# Patient Record
Sex: Female | Born: 1955 | Race: White | Hispanic: No | Smoking: Former smoker
Health system: Southern US, Community
[De-identification: ages and names within clinical notes are randomized; demographics above are authoritative.]

## PROBLEM LIST (undated history)

## (undated) DIAGNOSIS — J449 Chronic obstructive pulmonary disease, unspecified: Secondary | ICD-10-CM

## (undated) DIAGNOSIS — I10 Essential (primary) hypertension: Secondary | ICD-10-CM

---

## 2007-06-12 ENCOUNTER — Emergency Department (HOSPITAL_COMMUNITY): Admission: EM | Admit: 2007-06-12 | Discharge: 2007-06-12 | Payer: Self-pay | Admitting: Emergency Medicine

## 2011-10-18 LAB — CBC
Hemoglobin: 14.7
MCHC: 33.4
Platelets: 225
RDW: 13.6

## 2011-10-18 LAB — DIFFERENTIAL
Basophils Absolute: 0
Basophils Relative: 1
Monocytes Absolute: 0.5
Neutro Abs: 5
Neutrophils Relative %: 62

## 2011-10-18 LAB — BASIC METABOLIC PANEL
BUN: 17
CO2: 26
Calcium: 9.7
Creatinine, Ser: 0.85
Glucose, Bld: 114 — ABNORMAL HIGH

## 2011-10-18 LAB — POCT CARDIAC MARKERS
CKMB, poc: 1.6
Operator id: 288831

## 2016-07-19 DIAGNOSIS — I10 Essential (primary) hypertension: Secondary | ICD-10-CM | POA: Diagnosis present

## 2022-01-08 ENCOUNTER — Encounter (HOSPITAL_COMMUNITY): Payer: Self-pay | Admitting: Emergency Medicine

## 2022-01-08 ENCOUNTER — Inpatient Hospital Stay (HOSPITAL_COMMUNITY): Payer: 59

## 2022-01-08 ENCOUNTER — Emergency Department (HOSPITAL_COMMUNITY): Payer: 59

## 2022-01-08 ENCOUNTER — Inpatient Hospital Stay (HOSPITAL_COMMUNITY)
Admission: EM | Admit: 2022-01-08 | Discharge: 2022-01-12 | DRG: 177 | Disposition: A | Discharge status: Home / Self Care | Payer: 59 | Attending: Student in an Organized Health Care Education/Training Program | Admitting: Student in an Organized Health Care Education/Training Program

## 2022-01-08 DIAGNOSIS — J9622 Acute and chronic respiratory failure with hypercapnia: Secondary | ICD-10-CM | POA: Diagnosis present

## 2022-01-08 DIAGNOSIS — F17210 Nicotine dependence, cigarettes, uncomplicated: Secondary | ICD-10-CM | POA: Diagnosis present

## 2022-01-08 DIAGNOSIS — G9341 Metabolic encephalopathy: Secondary | ICD-10-CM | POA: Diagnosis present

## 2022-01-08 DIAGNOSIS — U071 COVID-19: Principal | ICD-10-CM | POA: Diagnosis present

## 2022-01-08 DIAGNOSIS — J9601 Acute respiratory failure with hypoxia: Secondary | ICD-10-CM | POA: Diagnosis present

## 2022-01-08 DIAGNOSIS — R7303 Prediabetes: Secondary | ICD-10-CM | POA: Diagnosis present

## 2022-01-08 DIAGNOSIS — R0603 Acute respiratory distress: Secondary | ICD-10-CM

## 2022-01-08 DIAGNOSIS — E669 Obesity, unspecified: Secondary | ICD-10-CM | POA: Diagnosis present

## 2022-01-08 DIAGNOSIS — J440 Chronic obstructive pulmonary disease with acute lower respiratory infection: Secondary | ICD-10-CM | POA: Diagnosis present

## 2022-01-08 DIAGNOSIS — I248 Other forms of acute ischemic heart disease: Secondary | ICD-10-CM | POA: Diagnosis present

## 2022-01-08 DIAGNOSIS — J189 Pneumonia, unspecified organism: Secondary | ICD-10-CM

## 2022-01-08 DIAGNOSIS — J9600 Acute respiratory failure, unspecified whether with hypoxia or hypercapnia: Secondary | ICD-10-CM

## 2022-01-08 DIAGNOSIS — J9602 Acute respiratory failure with hypercapnia: Secondary | ICD-10-CM | POA: Diagnosis present

## 2022-01-08 DIAGNOSIS — I10 Essential (primary) hypertension: Secondary | ICD-10-CM | POA: Diagnosis present

## 2022-01-08 DIAGNOSIS — J441 Chronic obstructive pulmonary disease with (acute) exacerbation: Secondary | ICD-10-CM | POA: Diagnosis present

## 2022-01-08 DIAGNOSIS — I459 Conduction disorder, unspecified: Secondary | ICD-10-CM | POA: Diagnosis present

## 2022-01-08 DIAGNOSIS — J1282 Pneumonia due to coronavirus disease 2019: Secondary | ICD-10-CM | POA: Diagnosis present

## 2022-01-08 DIAGNOSIS — I214 Non-ST elevation (NSTEMI) myocardial infarction: Secondary | ICD-10-CM

## 2022-01-08 DIAGNOSIS — J9621 Acute and chronic respiratory failure with hypoxia: Secondary | ICD-10-CM | POA: Diagnosis present

## 2022-01-08 DIAGNOSIS — R011 Cardiac murmur, unspecified: Secondary | ICD-10-CM

## 2022-01-08 HISTORY — DX: Chronic obstructive pulmonary disease, unspecified: J44.9

## 2022-01-08 LAB — CBG MONITORING, ED
Glucose-Capillary: 161 mg/dL — ABNORMAL HIGH (ref 70–99)
Glucose-Capillary: 150 mg/dL — ABNORMAL HIGH (ref 70–99)
Glucose-Capillary: 132 mg/dL — ABNORMAL HIGH (ref 70–99)

## 2022-01-08 LAB — COMPREHENSIVE METABOLIC PANEL
ALT: 18 U/L (ref 0–44)
AST: 23 U/L (ref 15–41)
Albumin: 2.9 g/dL — ABNORMAL LOW (ref 3.5–5.0)
Alkaline Phosphatase: 141 U/L — ABNORMAL HIGH (ref 38–126)
Anion gap: 13 (ref 5–15)
BUN: 23 mg/dL (ref 8–23)
CO2: 25 mmol/L (ref 22–32)
Calcium: 8.7 mg/dL — ABNORMAL LOW (ref 8.9–10.3)
Chloride: 97 mmol/L — ABNORMAL LOW (ref 98–111)
Creatinine, Ser: 0.9 mg/dL (ref 0.44–1.00)
GFR, Estimated: >60 mL/min (ref >60)
Glucose, Bld: 181 mg/dL — ABNORMAL HIGH (ref 70–99)
Potassium: 3.7 mmol/L (ref 3.5–5.1)
Sodium: 135 mmol/L (ref 135–145)
Total Bilirubin: 0.4 mg/dL (ref 0.3–1.2)
Total Protein: 7 g/dL (ref 6.5–8.1)

## 2022-01-08 LAB — URINALYSIS, ROUTINE W REFLEX MICROSCOPIC
Glucose, UA: NEGATIVE mg/dL
Ketones, ur: NEGATIVE mg/dL
Leukocytes,Ua: NEGATIVE
Nitrite: NEGATIVE
Protein, ur: 100 mg/dL — AB
Specific Gravity, Urine: 1.025 (ref 1.005–1.030)
pH: 6.5 (ref 5.0–8.0)

## 2022-01-08 LAB — URINALYSIS, MICROSCOPIC (REFLEX)

## 2022-01-08 LAB — I-STAT VENOUS BLOOD GAS, ED
Acid-Base Excess: 2 mmol/L (ref 0.0–2.0)
Bicarbonate: 31.8 mmol/L — ABNORMAL HIGH (ref 20.0–28.0)
Calcium, Ion: 1.15 mmol/L (ref 1.15–1.40)
HCT: 39 % (ref 36.0–46.0)
Hemoglobin: 13.3 g/dL (ref 12.0–15.0)
O2 Saturation: 84 %
Potassium: 3.7 mmol/L (ref 3.5–5.1)
Sodium: 137 mmol/L (ref 135–145)
TCO2: 34 mmol/L — ABNORMAL HIGH (ref 22–32)
pCO2, Ven: 71.8 mmHg (ref 44.0–60.0)
pH, Ven: 7.254 (ref 7.250–7.430)
pO2, Ven: 59 mmHg — ABNORMAL HIGH (ref 32.0–45.0)

## 2022-01-08 LAB — PROCALCITONIN: Procalcitonin: 1.12 ng/mL

## 2022-01-08 LAB — CBC WITH DIFFERENTIAL/PLATELET
Abs Immature Granulocytes: 0 10*3/uL (ref 0.00–0.07)
Band Neutrophils: 24 %
Basophils Absolute: 0 10*3/uL (ref 0.0–0.1)
Basophils Relative: 0 %
Eosinophils Absolute: 0 10*3/uL (ref 0.0–0.5)
Eosinophils Relative: 0 %
HCT: 40.3 % (ref 36.0–46.0)
Hemoglobin: 12.5 g/dL (ref 12.0–15.0)
Lymphocytes Relative: 6 %
Lymphs Abs: 1 10*3/uL (ref 0.7–4.0)
MCH: 29.8 pg (ref 26.0–34.0)
MCHC: 31 g/dL (ref 30.0–36.0)
MCV: 96 fL (ref 80.0–100.0)
Monocytes Absolute: 1.5 10*3/uL — ABNORMAL HIGH (ref 0.1–1.0)
Monocytes Relative: 9 %
Neutro Abs: 14.4 10*3/uL — ABNORMAL HIGH (ref 1.7–7.7)
Neutrophils Relative %: 61 %
Platelets: 260 10*3/uL (ref 150–400)
RBC: 4.2 MIL/uL (ref 3.87–5.11)
RDW: 15.6 % — ABNORMAL HIGH (ref 11.5–15.5)
WBC: 16.9 10*3/uL — ABNORMAL HIGH (ref 4.0–10.5)
nRBC: 0 /100 WBC
nRBC: 0.2 % (ref 0.0–0.2)

## 2022-01-08 LAB — RESP PANEL BY RT-PCR (FLU A&B, COVID) ARPGX2
Influenza A by PCR: NEGATIVE
Influenza B by PCR: NEGATIVE
SARS Coronavirus 2 by RT PCR: POSITIVE — AB

## 2022-01-08 LAB — ECHOCARDIOGRAM COMPLETE
AR max vel: 2.84 cm2
AV Area VTI: 2.55 cm2
AV Area mean vel: 2.86 cm2
AV Mean grad: 15 mmHg
AV Peak grad: 25.4 mmHg
Ao pk vel: 2.52 m/s
Area-P 1/2: 4.49 cm2
Calc EF: 58.9 %
S' Lateral: 2.6 cm
Single Plane A2C EF: 59 %
Single Plane A4C EF: 56.9 %

## 2022-01-08 LAB — LACTIC ACID, PLASMA
Lactic Acid, Venous: 1.8 mmol/L (ref 0.5–1.9)
Lactic Acid, Venous: 1.3 mmol/L (ref 0.5–1.9)

## 2022-01-08 LAB — TROPONIN I (HIGH SENSITIVITY)
Troponin I (High Sensitivity): 227 ng/L (ref <18)
Troponin I (High Sensitivity): 349 ng/L (ref <18)
Troponin I (High Sensitivity): 217 ng/L (ref <18)
Troponin I (High Sensitivity): 222 ng/L (ref <18)

## 2022-01-08 LAB — D-DIMER, QUANTITATIVE: D-Dimer, Quant: 3.72 ug/mL-FEU — ABNORMAL HIGH (ref 0.00–0.50)

## 2022-01-08 LAB — PROTIME-INR
INR: 1.3 — ABNORMAL HIGH (ref 0.8–1.2)
Prothrombin Time: 16 seconds — ABNORMAL HIGH (ref 11.4–15.2)

## 2022-01-08 LAB — APTT: aPTT: 41 seconds — ABNORMAL HIGH (ref 24–36)

## 2022-01-08 LAB — BRAIN NATRIURETIC PEPTIDE: B Natriuretic Peptide: 2628.4 pg/mL — ABNORMAL HIGH (ref 0.0–100.0)

## 2022-01-08 MED ORDER — LACTATED RINGERS IV SOLN: INTRAVENOUS | Status: DC

## 2022-01-08 MED ORDER — SODIUM CHLORIDE 0.9% FLUSH
3.0000 mL | Freq: Two times a day (BID) | INTRAVENOUS | Status: DC
  Administered 2022-01-09 – 2022-01-12 (×3): 3 mL via INTRAVENOUS

## 2022-01-08 MED ORDER — SODIUM CHLORIDE 0.9 % IV BOLUS: 500.0000 mL | Freq: Once | INTRAVENOUS | Status: DC

## 2022-01-08 MED ORDER — METHYLPREDNISOLONE SODIUM SUCC 125 MG IJ SOLR
60.0000 mg | Freq: Once | INTRAMUSCULAR | Status: AC
  Administered 2022-01-08: 60 mg via INTRAVENOUS
  Filled 2022-01-08: qty 2

## 2022-01-08 MED ORDER — NICOTINE 14 MG/24HR TD PT24
14.0000 mg | MEDICATED_PATCH | Freq: Every day | TRANSDERMAL | Status: DC
  Administered 2022-01-08 – 2022-01-09 (×2): 14 mg via TRANSDERMAL
  Filled 2022-01-08 (×2): qty 1

## 2022-01-08 MED ORDER — ENOXAPARIN SODIUM 40 MG/0.4ML IJ SOSY
40.0000 mg | PREFILLED_SYRINGE | INTRAMUSCULAR | Status: DC
  Administered 2022-01-08 – 2022-01-11 (×4): 40 mg via SUBCUTANEOUS
  Filled 2022-01-08 (×4): qty 0.4

## 2022-01-08 MED ORDER — SODIUM CHLORIDE 0.9 % IV SOLN
200.0000 mg | Freq: Once | INTRAVENOUS | Status: AC
  Administered 2022-01-08: 200 mg via INTRAVENOUS
  Filled 2022-01-08: qty 40

## 2022-01-08 MED ORDER — SODIUM CHLORIDE 0.9 % IV SOLN
100.0000 mg | Freq: Every day | INTRAVENOUS | Status: DC
  Administered 2022-01-09: 100 mg via INTRAVENOUS
  Filled 2022-01-08: qty 20

## 2022-01-08 MED ORDER — DEXAMETHASONE SODIUM PHOSPHATE 10 MG/ML IJ SOLN: 6.0000 mg | INTRAMUSCULAR | Status: DC

## 2022-01-08 MED ORDER — GUAIFENESIN-DM 100-10 MG/5ML PO SYRP: 10.0000 mL | ORAL_SOLUTION | ORAL | Status: DC | PRN

## 2022-01-08 MED ORDER — MAGNESIUM SULFATE 2 GM/50ML IV SOLN
2.0000 g | Freq: Once | INTRAVENOUS | Status: AC
  Administered 2022-01-08: 2 g via INTRAVENOUS
  Filled 2022-01-08: qty 50

## 2022-01-08 MED ORDER — IPRATROPIUM-ALBUTEROL 0.5-2.5 (3) MG/3ML IN SOLN
3.0000 mL | Freq: Once | RESPIRATORY_TRACT | Status: AC
  Administered 2022-01-08: 3 mL via RESPIRATORY_TRACT
  Filled 2022-01-08: qty 3

## 2022-01-08 MED ORDER — SODIUM CHLORIDE 0.9 % IV SOLN
500.0000 mg | Freq: Once | INTRAVENOUS | Status: AC
  Administered 2022-01-08: 500 mg via INTRAVENOUS
  Filled 2022-01-08: qty 5

## 2022-01-08 MED ORDER — INSULIN ASPART 100 UNIT/ML IJ SOLN
0.0000 [IU] | Freq: Three times a day (TID) | INTRAMUSCULAR | Status: DC
  Administered 2022-01-08: 2 [IU] via SUBCUTANEOUS
  Administered 2022-01-08: 3 [IU] via SUBCUTANEOUS

## 2022-01-08 MED ORDER — IOHEXOL 350 MG/ML SOLN
100.0000 mL | Freq: Once | INTRAVENOUS | Status: AC | PRN
  Administered 2022-01-08: 100 mL via INTRAVENOUS

## 2022-01-08 MED ORDER — SODIUM CHLORIDE 0.9 % IV SOLN
1.0000 g | Freq: Once | INTRAVENOUS | Status: AC
  Administered 2022-01-08: 1 g via INTRAVENOUS
  Filled 2022-01-08: qty 10

## 2022-01-08 MED ORDER — HYDROCOD POLST-CPM POLST ER 10-8 MG/5ML PO SUER
5.0000 mL | Freq: Two times a day (BID) | ORAL | Status: DC | PRN
  Administered 2022-01-08: 5 mL via ORAL
  Filled 2022-01-08: qty 5

## 2022-01-08 NOTE — ED Provider Notes (Addendum)
[facility] EMERGENCY DEPARTMENT Provider Note   CSN: [id number] Arrival date & time: 01/08/22  0222     History Chief Complaint  Patient presents with   Respiratory Distress    Natalie Alexander is a 66 y.o. female.  This is a 66 y.o. female with significant medical history as below, including tobacco use, does not follow with physician regularly  who presents to the ED with complaint of dib. Pt arrives by EMS, pt hypoxic on arrival to home in the 40's% per EMS, given nebs. Pt with delirium, possible slurred speech at home that has been transient, last known normal around 48 hours ago. Pt with non-productive cough, subjective fever/chills.  No nausea, vomiting, no change to bowel/bladder function. No home oxygen use, she does smoke cigarettes for multiple years. No falls, no recent etoh. She does not follow up with PCP regularly, has not seen PCP for many years. Stopped taking anti-hypertensive medication around 6 mos ago.   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 Possible exposure to diesel exhaust a couple days ago because window was left open while she was idling. Concern for possible CO/exhaust exposure. Last time she spoke to her was 2-3 days ago prior to this morning. Pt was confused while on the phone with her this morning, sounded like she was having DIB so sent family to check on her who called EMS   Level 5 caveat, respiratory distress   The history is provided by the patient. No language interpreter was used.  Shortness of Breath Severity:  Moderate Onset quality:  Gradual Timing:  Constant Progression:  Worsening Chronicity:  New Context: activity   Associated symptoms: fever      There are no problems to display for this patient.   Home Medications Prior to Admission medications   Medication Sig Start Date End Date Taking? Authorizing Provider  aspirin EC 81 MG tablet Take 81 mg by mouth  daily. Swallow whole.   Yes [provider]      Allergies    Patient has no known allergies.    Review of Systems   Review of Systems  Unable to perform ROS: Severe respiratory distress  Constitutional:  Positive for chills and fever.  Respiratory:  Positive for shortness of breath.    Physical Exam Updated Vital Signs BP (!) 111/55    Pulse 89    Temp 98.3 F (36.8 C)    Resp (!) 24    SpO2 99%  Physical Exam Vitals and nursing note reviewed. Exam conducted with a chaperone present.  Constitutional:      General: She is in acute distress.     Appearance: Normal appearance. She is ill-appearing.  HENT:     Head: Normocephalic and atraumatic.     Right Ear: External ear normal.     Left Ear: External ear normal.     Nose: Nose normal.     Mouth/Throat:     Mouth: Mucous membranes are moist.  Eyes:     General: No scleral icterus.       Right eye: No discharge.        Left eye: No discharge.  Cardiovascular:     Rate and Rhythm: Regular rhythm. Tachycardia present.     Pulses: Normal pulses.     Heart sounds: Murmur heard.  Pulmonary:     Effort: Tachypnea and respiratory distress present.     Breath sounds: Decreased  breath sounds and wheezing present.     Comments: Adventitious breath sounds b/l Abdominal:     General: Abdomen is flat. There is no distension.     Palpations: Abdomen is soft.     Tenderness: There is no abdominal tenderness.  Musculoskeletal:     Cervical back: Normal range of motion. No rigidity.  Neurological:     Mental Status: She is alert and oriented to person, place, and time.     GCS: GCS eye subscore is 4. GCS verbal subscore is 5. GCS motor subscore is 6.    ED Results / Procedures / Treatments   Labs (all labs ordered are listed, but only abnormal results are displayed) Labs Reviewed  RESP PANEL BY RT-PCR (FLU A&B, COVID) ARPGX2 - Abnormal; Notable for the following components:      Result Value   SARS Coronavirus 2 by RT  PCR POSITIVE (*)    All other components within normal limits  CBC WITH DIFFERENTIAL/PLATELET - Abnormal; Notable for the following components:   WBC 16.9 (*)    RDW 15.6 (*)    Neutro Abs 14.4 (*)    Monocytes Absolute 1.5 (*)    All other components within normal limits  BRAIN NATRIURETIC PEPTIDE - Abnormal; Notable for the following components:   B Natriuretic Peptide 2,628.4 (*)    All other components within normal limits  COMPREHENSIVE METABOLIC PANEL - Abnormal; Notable for the following components:   Chloride 97 (*)    Glucose, Bld 181 (*)    Calcium 8.7 (*)    Albumin 2.9 (*)    Alkaline Phosphatase 141 (*)    All other components within normal limits  D-DIMER, QUANTITATIVE - Abnormal; Notable for the following components:   D-Dimer, Quant 3.72 (*)    All other components within normal limits  PROTIME-INR - Abnormal; Notable for the following components:   Prothrombin Time 16.0 (*)    INR 1.3 (*)    All other components within normal limits  APTT - Abnormal; Notable for the following components:   aPTT 41 (*)    All other components within normal limits  I-STAT VENOUS BLOOD GAS, ED - Abnormal; Notable for the following components:   pCO2, Ven 71.8 (*)    pO2, Ven 59.0 (*)    Bicarbonate 31.8 (*)    TCO2 34 (*)    All other components within normal limits  TROPONIN I (HIGH SENSITIVITY) - Abnormal; Notable for the following components:   Troponin I (High Sensitivity) 227 (*)    All other components within normal limits  TROPONIN I (HIGH SENSITIVITY) - Abnormal; Notable for the following components:   Troponin I (High Sensitivity) 349 (*)    All other components within normal limits  EXPECTORATED SPUTUM ASSESSMENT W GRAM STAIN, RFLX TO RESP C  CULTURE, BLOOD (ROUTINE X 2)  CULTURE, BLOOD (ROUTINE X 2)  LACTIC ACID, PLASMA  CARBON MONOXIDE, BLOOD (PERFORMED AT REF LAB)  CARBOXYHEMOGLOBIN - COOX  PROCALCITONIN  LACTIC ACID, PLASMA  URINALYSIS, ROUTINE W REFLEX  MICROSCOPIC    EKG EKG Interpretation  Date/Time:  Monday January 08 2022 04:55:02 EST Ventricular Rate:  90 PR Interval:  125 QRS Duration: 112 QT Interval:  372 QTC Calculation: 456 R Axis:   57 Text Interpretation: Sinus rhythm Probable left atrial enlargement Borderline intraventricular conduction delay no stemi Confirmed by Tanda RockersGray, Yasin Ducat ([id number]) on 01/08/2022 5:18:49 AM  Radiology DG Chest Port 1 View  Result Date: 01/08/2022 CLINICAL DATA:  Respiratory distress  EXAM: PORTABLE CHEST 1 VIEW COMPARISON:  06/12/2007 FINDINGS: Bilateral interstitial opacities, worse in the right lung. Cardiomediastinal contours are normal. No pleural effusion or pneumothorax IMPRESSION: Bilateral interstitial opacities, which may indicate pulmonary edema or multifocal pneumonia. Electronically Signed   By: Deatra RobinsonKevin  Herman M.D.   On: 01/08/2022 03:04    Procedures .Critical Care Performed by: Sloan LeiterGray, Kirstyn Lean A, DO Authorized by: Sloan LeiterGray, Tiffane Sheldon A, DO   Critical care provider statement:    Critical care time (minutes):  47   Critical care time was exclusive of:  Separately billable procedures and treating other patients   Critical care was necessary to treat or prevent imminent or life-threatening deterioration of the following conditions:  Respiratory failure   Critical care was time spent personally by me on the following activities:  Development of treatment plan with patient or surrogate, discussions with consultants, evaluation of patient's response to treatment, examination of patient, ordering and review of laboratory studies, ordering and review of radiographic studies, ordering and performing treatments and interventions, pulse oximetry, re-evaluation of patient's condition and review of old charts   Care discussed with: admitting provider      Cardiac monitoring reviewed by myself shows sinus tachycardia   Medications Ordered in ED Medications  methylPREDNISolone sodium succinate (SOLU-MEDROL) 125  mg/2 mL injection 60 mg (60 mg Intravenous Given 01/08/22 0250)  magnesium sulfate IVPB 2 g 50 mL (0 g Intravenous Stopped 01/08/22 0401)  ipratropium-albuterol (DUONEB) 0.5-2.5 (3) MG/3ML nebulizer solution 3 mL (3 mLs Nebulization Given 01/08/22 0255)  cefTRIAXone (ROCEPHIN) 1 g in sodium chloride 0.9 % 100 mL IVPB (0 g Intravenous Stopped 01/08/22 0522)  azithromycin (ZITHROMAX) 500 mg in sodium chloride 0.9 % 250 mL IVPB (0 mg Intravenous Stopped 01/08/22 0640)  iohexol (OMNIPAQUE) 350 MG/ML injection 100 mL (100 mLs Intravenous Contrast Given 01/08/22 0700)    ED Course/ Medical Decision Making/ A&P                           Medical Decision Making   CC: dib  This patient complains of dib; this involves an extensive number of treatment options and is a complaint that carries with it a high risk of complications and morbidity. Vital signs were reviewed. Serious etiologies considered.  Record review:  Previous records obtained and reviewed   Additional history obtained from daughter   Work up as above, notable for:  Labs & imaging results that were available during my care of the patient were reviewed by me and considered in my medical decision making.   I ordered imaging studies which included CXR and I independently visualized and interpreted imaging which showed concern for multifocal pneumonia/edema  Daughter was concern for possible exhaust exposure, carbon oxide.  Will check carboxyhemoglobin level  Management: Patient given nebulized breathing treatments, steroids, placed on NIPPV; respiratory status greatly improved on NIPPV  Reassessment:  Respiratory status continues to improve on BIPAP.  VBG has resulted, significant hypercapnia with acidosis. Pt found to be positive for COVID19, empiric antibiotics were started prior to viral panel returning.   Trop's elevated, BNP also elevated, EKG without acute ischemia. Given [phone] and respiratory distress, cardiomegaly on imaging/pulm  edema; concern for possible covid myocarditis. Would recommend cardiology evaluation given uptrending troponin. BP is stable.   Mental status greatly improved on BIPAP, prior delirium likely 2/2 hypercarbia.   Pt pending CT imaging at time of shift handoff, care transferred to Dr Rodena MedinMessick pending CT imaging and admission. Pt  is HDS, stable on BIPAP. If CTH is negative would recommend starting heparin for NSTEMI.        Counseled patient for approximately 3 minutes regarding smoking cessation. Discussed risks of smoking and how they applied and affected their visit here today. Patient not ready to quit at this time, however will follow up with their primary doctor when they are.   CPT code: 38182: intermediate counseling for smoking cessation     This chart was dictated using voice recognition software.  Despite best efforts to proofread,  errors can occur which can change the documentation meaning.  Final Clinical Impression(s) / ED Diagnoses Final diagnoses:  Multifocal pneumonia  NSTEMI (non-ST elevated myocardial infarction) (HCC)  COVID-19  Acute respiratory failure with hypoxia and hypercapnia [facility])    Rx / DC Orders ED Discharge Orders     None         Sloan Leiter, DO 01/08/22 0704    Sloan Leiter, DO 01/08/22 [phone]

## 2022-01-08 NOTE — ED Notes (Signed)
Pt titrated to humidified high flow @ 5LPM per RT and Dr Mcarthur Rossetti

## 2022-01-08 NOTE — H&P (Addendum)
Date: 01/08/2022               Patient Name:  Natalie Alexander MRN: [id number]  DOB: Oct 08, 1956 Age / Sex: 66 y.o., female   PCP: Elisabeth Cara, PA-C         Medical Service: Internal Medicine Teaching Service         Attending Physician: Dr. Evette Doffing, Mallie Mussel, *    First Contact: Buddy Duty, DO Pager: RA [phone]  Second Contact: Hadassah Pais, MD Pager: PB [phone]       After Hours (After 5p/  First Contact Pager: [phone]  weekends / holidays): Second Contact Pager: [phone]   SUBJECTIVE  Chief Complaint: dyspnea  History of Present Illness: Natalie Alexander is a 66 y.o. female with a pertinent PMH of tobacco use disorder, hypertension, and suspected COPD , who presents to [facility] with dyspnea.  History obtained by patient and chart review. Natalie Alexander was evaluated on BiPAP. She reports that she has not been feeling well for the past week with a productive cough with clear sputum and subjective fevers/chills. Prior to this, she reports one month of fatigue. There were initially concerns for delirium - possible transient slurred speech at home with last known normal around 48 hours prior. Patient reportedly confused while talking to family on the phone for which family sent EMS to check on her. She was reportedly hypoxic to 44% on room air on initial evaluation by EMS and placed on O2 with improvement to >85%. She received albuterol and atrovent. She denies any chest pain, headaches, insomnia, focal weakness, confusion, urinary symptoms, nausea/vomiting, abdominal pain or diarrhea.  There were also concerns for possible carbon monoxide/exhaust exposure per family on initial presentation.   In the ED, the patient was placed on BiPAP with improvement in respiratory status and encephalopathy. VBG with significant acidosis and hypercapnia. Labs significant for neutrophil predominant leukocytosis, elevated BNP, D-dimer, and troponins. CXR with diffuse interstitial  opacities concerning for multifocal pneumonia  vs pulmonary edema. CTA Chest negative for PE but did demonstrate severe multilobar bilateral bronchopneumonia. She was found to be COVID positive. Patient received rocephin, azithromycin, magnesium, duonebs, and IV solumedrol in the ED. Patient admitted for further management.   Medications: No current facility-administered medications on file prior to encounter.   Current Outpatient Medications on File Prior to Encounter  Medication Sig Dispense Refill   aspirin EC 81 MG tablet Take 81 mg by mouth daily. Swallow whole.      Past Medical History:  Past Medical History:  Diagnosis Date   COPD (chronic obstructive pulmonary disease) ([place])     Social:  Patient is a long Associate Professor for 34 years. She mostly operates along the [place] smoking at least 1.5 ppd "for a long time", unable to quantify how long though. She denies any alcohol use or any illicit substance use. PCP is [place], [place] - patient last evaluated by PCP 08/2020.    Family History: Heart disease in mother and father   Allergies: Allergies as of 01/08/2022   (No Known Allergies)    Review of Systems: A complete ROS was negative except as per HPI.   OBJECTIVE:  Physical Exam: Blood pressure 111/78, pulse 94, temperature 98.3 F (36.8 C), resp. rate (!) 31, SpO2 93 %. Physical Exam  Constitutional: Chronically ill appearing elderly female, in mild respiratory distress HENT: Normocephalic and atraumatic, anicteric sclerae, conjunctiva normal, poor dentition  Cardiovascular: Normal rate, regular rhythm, S1  and S2 present, +systolic murmur, no rubs, gallops.  Distal pulses intact Respiratory: Diffuse crackles on exam; on BiPAP support.  GI: Nondistended, soft, nontender to palpation, normal bowel sounds Musculoskeletal: Normal bulk and tone.  No peripheral edema noted. Neurological: Is alert and oriented x4, spontaneously moving all extremities  without any apparent focal deficits noted  Skin: Warm and dry.  Multiple superficial lesions noted in bilateral upper extremities, healing without signs of infection. No rash, erythema noted. Psychiatric: Normal mood and affect. Behavior is normal.   Pertinent Labs: CBC    Component Value Date/Time   WBC 16.9 (H) 01/08/2022 0224   RBC 4.20 01/08/2022 0224   HGB 13.3 01/08/2022 0546   HCT 39.0 01/08/2022 0546   PLT 260 01/08/2022 0224   MCV 96.0 01/08/2022 0224   MCH 29.8 01/08/2022 0224   MCHC 31.0 01/08/2022 0224   RDW 15.6 (H) 01/08/2022 0224   LYMPHSABS 1.0 01/08/2022 0224   MONOABS 1.5 (H) 01/08/2022 0224   EOSABS 0.0 01/08/2022 0224   BASOSABS 0.0 01/08/2022 0224     CMP     Component Value Date/Time   NA 137 01/08/2022 0546   K 3.7 01/08/2022 0546   CL 97 (L) 01/08/2022 0224   CO2 25 01/08/2022 0224   GLUCOSE 181 (H) 01/08/2022 0224   BUN 23 01/08/2022 0224   CREATININE 0.90 01/08/2022 0224   CALCIUM 8.7 (L) 01/08/2022 0224   PROT 7.0 01/08/2022 0224   ALBUMIN 2.9 (L) 01/08/2022 0224   AST 23 01/08/2022 0224   ALT 18 01/08/2022 0224   ALKPHOS 141 (H) 01/08/2022 0224   BILITOT 0.4 01/08/2022 0224   GFRNONAA >60 01/08/2022 0224   GFRAA  06/12/2007 0754    >60        The eGFR has been calculated using the MDRD equation. This calculation has not been validated in all clinical   B Natriuretic Peptide 0.0 - 100.0 pg/mL 2,628.4 High     Troponin I (High Sensitivity) <18 ng/L 349 High Panic   227 High Panic  CM    D-Dimer, Quant 0.00 - 0.50 ug/mL-FEU 3.72 High     Pertinent Imaging: CT HEAD WO CONTRAST (5MM)  Result Date: 01/08/2022 CLINICAL DATA:  66 year old female with history of shortness of breath. Respiratory distress. Altered mental status. EXAM: CT HEAD WITHOUT CONTRAST TECHNIQUE: Contiguous axial images were obtained from the base of the skull through the vertex without intravenous contrast. COMPARISON:  No priors. FINDINGS: Brain: No evidence of acute  infarction, hemorrhage, hydrocephalus, extra-axial collection or mass lesion/mass effect. Vascular: No hyperdense vessel or unexpected calcification. Skull: Normal. Negative for fracture or focal lesion. Sinuses/Orbits: No acute finding. Other: None. IMPRESSION: 1. No acute intracranial abnormalities. The appearance of the brain is normal. Electronically Signed   By: Vinnie Langton M.D.   On: 01/08/2022 07:05   CT Angio Chest PE W and/or Wo Contrast  Result Date: 01/08/2022 CLINICAL DATA:  66 year old female with history of shortness of breath. Positive D-dimer. COVID positive patient. Altered mental status. EXAM: CT ANGIOGRAPHY CHEST WITH CONTRAST TECHNIQUE: Multidetector CT imaging of the chest was performed using the standard protocol during bolus administration of intravenous contrast. Multiplanar CT image reconstructions and MIPs were obtained to evaluate the vascular anatomy. CONTRAST:  113m OMNIPAQUE IOHEXOL 350 MG/ML SOLN COMPARISON:  No priors. FINDINGS: Cardiovascular: No filling defects within the pulmonary arterial tree to suggest pulmonary embolism. Heart size is mildly enlarged with concentric left ventricular hypertrophy. There is no significant pericardial fluid, thickening  or pericardial calcification. There is aortic atherosclerosis, as well as atherosclerosis of the great vessels of the mediastinum and the coronary arteries, including calcified atherosclerotic plaque in the left anterior descending and left circumflex coronary arteries. Thickening calcification of the aortic valve. Mediastinum/Nodes: No pathologically enlarged mediastinal or hilar lymph nodes. Esophagus is unremarkable in appearance. No axillary lymphadenopathy. Lungs/Pleura: Diffuse bronchial wall thickening with patchy areas of thickening of the peribronchovascular interstitium and extensive peribronchovascular ground-glass attenuation, micro nodularity and macro nodularity scattered throughout the lungs bilaterally,  indicative of severe bilateral multilobar bronchopneumonia. No pleural effusions. No definite larger more suspicious appearing pulmonary nodules or masses are noted. Upper Abdomen: Aortic atherosclerosis. 1.2 cm low-attenuation lesion in segment 3 of the liver is compatible with a small cyst. Atherosclerotic calcifications in the thoracic aorta scratch the atherosclerotic calcifications in the abdominal aorta. Musculoskeletal: There are no aggressive appearing lytic or blastic lesions noted in the visualized portions of the skeleton. Review of the MIP images confirms the above findings. IMPRESSION: 1. No evidence of pulmonary embolism. 2. Severe bronchitis with severe multilobar bilateral bronchopneumonia, as above. 3. Cardiomegaly with concentric left ventricular hypertrophy. 4. There are calcifications of the aortic valve. Echocardiographic correlation for evaluation of potential valvular dysfunction may be warranted if clinically indicated. 5. Aortic atherosclerosis, in addition to 2 vessel coronary artery disease. Please note that although the presence of coronary artery calcium documents the presence of coronary artery disease, the severity of this disease and any potential stenosis cannot be assessed on this non-gated CT examination. Assessment for potential risk factor modification, dietary therapy or pharmacologic therapy may be warranted, if clinically indicated. Aortic Atherosclerosis (ICD10-I70.0). Electronically Signed   By: Vinnie Langton M.D.   On: 01/08/2022 07:28   DG Chest Port 1 View  Result Date: 01/08/2022 CLINICAL DATA:  Respiratory distress EXAM: PORTABLE CHEST 1 VIEW COMPARISON:  06/12/2007 FINDINGS: Bilateral interstitial opacities, worse in the right lung. Cardiomediastinal contours are normal. No pleural effusion or pneumothorax IMPRESSION: Bilateral interstitial opacities, which may indicate pulmonary edema or multifocal pneumonia. Electronically Signed   By: Ulyses Jarred M.D.   On:  01/08/2022 03:04    EKG: personally reviewed my interpretation is there are no previous tracings available for comparison, normal sinus rhythm, diffuse ST depression; intraventricular conduction delay; Qtc 456  ASSESSMENT & PLAN:  Assessment: Principal Problem:   Acute respiratory failure due to COVID-19 ([place])   Natalie Alexander is a 66 y.o. with pertinent PMH of tobacco use disorder, COPD, and hypertension who presented with acute encephalopathy and acute hypoxic respiratory failure and admit for acute hypoxic and hypercapnic respiratory failure in setting of COVID pneumonia on hospital day 0  Plan: #Acute hypoxic respiratory failure 2/2 COVID-19 Pneumonia  Patient presented with acute encephalopathy and acute hypoxic respiratory failure with initial oxygen saturations noted to be in the 40's on room air with EMS requiring BiPAP support. This resulted in improvement in mental status. Patient reports that she has been feeling more fatigued over the past month with 2-3 days of feeling more fatigued with subjective fevers/chills and productive cough of white sputum. Patient noted to have multifocal pneumonia on CXR and CTA Chest and was found to be COVID positive. She has not received COVID vaccination. Suspect that patient's symptoms are likely secondary to hypoxia in setting of COVID pneumonia. 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 Patient's symptoms improved with oxygen supplementation.  Patient may have component of superimposed bacterial  pneumonia; she received azithromycin and rocephin in the ED. Respiratory status slightly improved and patient transitioned from BiPAP to HFNC. CTA chest with multilobar bronchopneumonia, suspect this is likely from COVID infection.  Given history of extensive tobacco use, patient may have component of COPD contributing to her respiratory failure. She has not had formal PFT's prior to this. She  did receive duonebs, magnesium and steroids on presentation with improvement. At this time, will continue treatment for COVID pneumonia.  - IV dexamethasone and remdesivir - Oxygen supplementation to maintain SpO2 >88% - F/u procalcitonin -if elevated, resume antibiotics  - F/u blood CO and carboxyhemoglobin levels - Trend inflammatory markers  - Cough suppressants prn - Encourage ICS & flutter valve  #Systolic murmur  Patient noted to have systolic murmur on exam. She notes being told previously that she has a murmur; however, unable to recall any further testing on my evaluation. No prior Echo noted in patient's chart. BNP is elevated >2600; however, patient denies any symptoms of heart failure. Although she does have diffuse crackles on exam, no significant JVD or peripheral edema noted on exam. Does not appear to be in acute heart failure at this time.  - Echo  - Trend I/O  #Type II NSTEMI Patient noted to have elevated troponins 227>349. Patient does not endorse any chest pain. EKG with diffuse ST segment depression and conduction delay without prior tracings noted. Suspect patient's elevated troponins are in setting of demand ischemia in setting of hypoxia.  - Will hold off on heparin gtt at this time  - Cardiac monitoring  #Tobacco use disorder Patient has a history of significant tobacco use disorder with up to 1.5ppd.  - Nicotine patch  - Encourage cessation   #Elevated glucose Patient noted to have elevated glucose to 181 on CMP. She does not have a known history of diabetes. She is currently on IV steroids for COVID pneumonia as above.  - HbA1c - CBG monitoring and SSI tid w meals  #Hx of hypertension Patient has a history of hypertension for which she was previously on Exforge; however, appears that this has previously been discontinued. Patient noted to be hypotensive on presentation.  - Will hold off on resuming antihypertensives  Best Practice: Diet: Cardiac  diet IVF: Fluids: None, Rate: None VTE: enoxaparin (LOVENOX) injection 40 mg Start: 01/08/22 1800 Code: Full AB: s/p Rocephin and azithromycin Status: Inpatient with expected length of stay greater than 2 midnights. Anticipated Discharge Location: Home Barriers to Discharge: Medical stability and New oxygen  Signature: Harvie Heck, MD Internal Medicine Resident, PGY-3 [facility]  Pager: [phone] 9:58 AM, 01/08/2022   Please contact the on call pager after 5 pm and on weekends at [phone].

## 2022-01-08 NOTE — ED Triage Notes (Signed)
Pt presents w/ respiratory distress, found on RA at home at 40%. Pt placed on NRB satting appropriately. Pt hx of COPD and smoker. Slurred speech present on arrival, last known well 2 days ago.

## 2022-01-08 NOTE — Sepsis Progress Note (Addendum)
Elink following for Sepsis Protocol    Communication took place with MD, no fluids being ordered, + Covid

## 2022-01-08 NOTE — ED Notes (Signed)
Critical result given to MD Tegler

## 2022-01-08 NOTE — ED Notes (Signed)
There was a discrepancy regarding pt's bipap, pt is now on servo bipap per RT which requires ICU level of care. Dr Francia Greaves was updated on this. At this time inpatient doctor rounded on pt and RT was leaving the room from making this bipap change. This RN asked Dr Francia Greaves to come discuss with admitting doctor and RT to determine appropriate plan. Interdisciplinary team meeting outside of pt room at this time to determine appropriate level of care.

## 2022-01-08 NOTE — ED Notes (Signed)
Patient transported to CT via stretcher.  Pt placed on NRB for transport and CT scan.  O2 sat 98% on NRB

## 2022-01-08 NOTE — ED Provider Notes (Signed)
Patient seen after prior EDP.  Patient is comfortable on current BiPAP support.  She is speaking in full sentences.  She understands plan of care.  Admitting team is aware of case.  They will evaluate for admission.  They are aware of pending CTA PE.   Wynetta Fines, MD 01/08/22 [phone]

## 2022-01-09 DIAGNOSIS — J1282 Pneumonia due to coronavirus disease 2019: Secondary | ICD-10-CM

## 2022-01-09 DIAGNOSIS — J9601 Acute respiratory failure with hypoxia: Secondary | ICD-10-CM | POA: Diagnosis present

## 2022-01-09 DIAGNOSIS — J9602 Acute respiratory failure with hypercapnia: Secondary | ICD-10-CM | POA: Diagnosis present

## 2022-01-09 LAB — I-STAT ARTERIAL BLOOD GAS, ED
Acid-Base Excess: 8 mmol/L — ABNORMAL HIGH (ref 0.0–2.0)
Acid-Base Excess: 11 mmol/L — ABNORMAL HIGH (ref 0.0–2.0)
Bicarbonate: 37.1 mmol/L — ABNORMAL HIGH (ref 20.0–28.0)
Bicarbonate: 38.8 mmol/L — ABNORMAL HIGH (ref 20.0–28.0)
Calcium, Ion: 1.25 mmol/L (ref 1.15–1.40)
Calcium, Ion: 1.25 mmol/L (ref 1.15–1.40)
HCT: 35 % — ABNORMAL LOW (ref 36.0–46.0)
HCT: 34 % — ABNORMAL LOW (ref 36.0–46.0)
Hemoglobin: 11.9 g/dL — ABNORMAL LOW (ref 12.0–15.0)
Hemoglobin: 11.6 g/dL — ABNORMAL LOW (ref 12.0–15.0)
O2 Saturation: 95 %
O2 Saturation: 92 %
Potassium: 4.1 mmol/L (ref 3.5–5.1)
Potassium: 4 mmol/L (ref 3.5–5.1)
Sodium: 139 mmol/L (ref 135–145)
Sodium: 140 mmol/L (ref 135–145)
TCO2: 39 mmol/L — ABNORMAL HIGH (ref 22–32)
TCO2: 41 mmol/L — ABNORMAL HIGH (ref 22–32)
pCO2 arterial: 78.5 mmHg (ref 32.0–48.0)
pCO2 arterial: 68.6 mmHg (ref 32.0–48.0)
pH, Arterial: 7.283 — ABNORMAL LOW (ref 7.350–7.450)
pH, Arterial: 7.361 (ref 7.350–7.450)
pO2, Arterial: 88 mmHg (ref 83.0–108.0)
pO2, Arterial: 71 mmHg — ABNORMAL LOW (ref 83.0–108.0)

## 2022-01-09 LAB — COMPREHENSIVE METABOLIC PANEL
ALT: 17 U/L (ref 0–44)
AST: 39 U/L (ref 15–41)
Albumin: 2.6 g/dL — ABNORMAL LOW (ref 3.5–5.0)
Alkaline Phosphatase: 153 U/L — ABNORMAL HIGH (ref 38–126)
Anion gap: 11 (ref 5–15)
BUN: 25 mg/dL — ABNORMAL HIGH (ref 8–23)
CO2: 31 mmol/L (ref 22–32)
Calcium: 9 mg/dL (ref 8.9–10.3)
Chloride: 97 mmol/L — ABNORMAL LOW (ref 98–111)
Creatinine, Ser: 0.71 mg/dL (ref 0.44–1.00)
GFR, Estimated: >60 mL/min (ref >60)
Glucose, Bld: 116 mg/dL — ABNORMAL HIGH (ref 70–99)
Potassium: 5.4 mmol/L — ABNORMAL HIGH (ref 3.5–5.1)
Sodium: 139 mmol/L (ref 135–145)
Total Bilirubin: 2.3 mg/dL — ABNORMAL HIGH (ref 0.3–1.2)
Total Protein: 6.3 g/dL — ABNORMAL LOW (ref 6.5–8.1)

## 2022-01-09 LAB — HIV ANTIBODY (ROUTINE TESTING W REFLEX): HIV Screen 4th Generation wRfx: NONREACTIVE

## 2022-01-09 LAB — CBC
HCT: 37.2 % (ref 36.0–46.0)
Hemoglobin: 11.5 g/dL — ABNORMAL LOW (ref 12.0–15.0)
MCH: 30.1 pg (ref 26.0–34.0)
MCHC: 30.9 g/dL (ref 30.0–36.0)
MCV: 97.4 fL (ref 80.0–100.0)
Platelets: 279 10*3/uL (ref 150–400)
RBC: 3.82 MIL/uL — ABNORMAL LOW (ref 3.87–5.11)
RDW: 15.9 % — ABNORMAL HIGH (ref 11.5–15.5)
WBC: 19.1 10*3/uL — ABNORMAL HIGH (ref 4.0–10.5)
nRBC: 0.2 % (ref 0.0–0.2)

## 2022-01-09 LAB — CBG MONITORING, ED
Glucose-Capillary: 130 mg/dL — ABNORMAL HIGH (ref 70–99)
Glucose-Capillary: 93 mg/dL (ref 70–99)

## 2022-01-09 LAB — GLUCOSE, CAPILLARY
Glucose-Capillary: 107 mg/dL — ABNORMAL HIGH (ref 70–99)
Glucose-Capillary: 132 mg/dL — ABNORMAL HIGH (ref 70–99)

## 2022-01-09 LAB — CARBON MONOXIDE, BLOOD (PERFORMED AT REF LAB): Carbon Monoxide, Blood: 3.6 % (ref 0.0–3.6)

## 2022-01-09 LAB — TSH: TSH: 0.617 u[IU]/mL (ref 0.350–4.500)

## 2022-01-09 LAB — C-REACTIVE PROTEIN: CRP: 40.3 mg/dL — ABNORMAL HIGH (ref <1.0)

## 2022-01-09 LAB — BRAIN NATRIURETIC PEPTIDE: B Natriuretic Peptide: 484.1 pg/mL — ABNORMAL HIGH (ref 0.0–100.0)

## 2022-01-09 LAB — PHOSPHORUS: Phosphorus: 2.7 mg/dL (ref 2.5–4.6)

## 2022-01-09 LAB — MAGNESIUM: Magnesium: 2.4 mg/dL (ref 1.7–2.4)

## 2022-01-09 LAB — LACTATE DEHYDROGENASE: LDH: 426 U/L — ABNORMAL HIGH (ref 98–192)

## 2022-01-09 LAB — FERRITIN: Ferritin: 159 ng/mL (ref 11–307)

## 2022-01-09 LAB — D-DIMER, QUANTITATIVE: D-Dimer, Quant: 3.38 ug/mL-FEU — ABNORMAL HIGH (ref 0.00–0.50)

## 2022-01-09 MED ORDER — ARFORMOTEROL TARTRATE 15 MCG/2ML IN NEBU
15.0000 ug | INHALATION_SOLUTION | Freq: Two times a day (BID) | RESPIRATORY_TRACT | Status: DC
  Administered 2022-01-09 – 2022-01-12 (×5): 15 ug via RESPIRATORY_TRACT
  Filled 2022-01-09 (×6): qty 2

## 2022-01-09 MED ORDER — CHLORHEXIDINE GLUCONATE CLOTH 2 % EX PADS
6.0000 | MEDICATED_PAD | Freq: Every day | CUTANEOUS | Status: DC
  Administered 2022-01-09 – 2022-01-12 (×5): 6 via TOPICAL

## 2022-01-09 MED ORDER — ALBUTEROL SULFATE (2.5 MG/3ML) 0.083% IN NEBU: 2.5000 mg | INHALATION_SOLUTION | RESPIRATORY_TRACT | Status: DC | PRN

## 2022-01-09 MED ORDER — BUDESONIDE 0.5 MG/2ML IN SUSP
0.5000 mg | Freq: Two times a day (BID) | RESPIRATORY_TRACT | Status: DC
  Administered 2022-01-09 – 2022-01-12 (×5): 0.5 mg via RESPIRATORY_TRACT
  Filled 2022-01-09 (×6): qty 2

## 2022-01-09 MED ORDER — DEXAMETHASONE 4 MG PO TABS
6.0000 mg | ORAL_TABLET | Freq: Every day | ORAL | Status: DC
  Filled 2022-01-09: qty 2

## 2022-01-09 MED ORDER — INSULIN ASPART 100 UNIT/ML IJ SOLN
0.0000 [IU] | Freq: Three times a day (TID) | INTRAMUSCULAR | Status: DC
  Administered 2022-01-09: 2 [IU] via SUBCUTANEOUS
  Administered 2022-01-10: 3 [IU] via SUBCUTANEOUS
  Administered 2022-01-10: 2 [IU] via SUBCUTANEOUS
  Administered 2022-01-10: 3 [IU] via SUBCUTANEOUS
  Administered 2022-01-11: 2 [IU] via SUBCUTANEOUS
  Administered 2022-01-11 – 2022-01-12 (×2): 3 [IU] via SUBCUTANEOUS

## 2022-01-09 MED ORDER — TOCILIZUMAB 400 MG/20ML IV SOLN
8.0000 mg/kg | Freq: Once | INTRAVENOUS | Status: AC
  Administered 2022-01-09: 590 mg via INTRAVENOUS
  Filled 2022-01-09: qty 10

## 2022-01-09 MED ORDER — DEXAMETHASONE SODIUM PHOSPHATE 10 MG/ML IJ SOLN
6.0000 mg | INTRAMUSCULAR | Status: DC
  Administered 2022-01-09: 6 mg via INTRAVENOUS
  Filled 2022-01-09: qty 1

## 2022-01-09 MED ORDER — METHYLPREDNISOLONE SODIUM SUCC 125 MG IJ SOLR
80.0000 mg | Freq: Every day | INTRAMUSCULAR | Status: DC
  Administered 2022-01-09 – 2022-01-11 (×3): 80 mg via INTRAVENOUS
  Filled 2022-01-09 (×3): qty 2

## 2022-01-09 MED ORDER — SODIUM CHLORIDE 0.9 % IV SOLN
INTRAVENOUS | Status: DC | PRN
  Administered 2022-01-09: 400 mL via INTRAVENOUS

## 2022-01-09 NOTE — ED Notes (Signed)
Purewick replaced and full linen change completed.

## 2022-01-09 NOTE — Consult Note (Signed)
NAME:  Natalie Alexander, MRN:  [id number], DOB:  01/01/1956, LOS: 1 ADMISSION DATE:  01/08/2022, CONSULTATION DATE: 01/09/2022 REFERRING MD: Internal medicine, CHIEF COMPLAINT: COVID-19 with respiratory failure  History of Present Illness:  66 year old female smoker who is admitted with COVID-19 and radiographic evidence of pneumonia.  She began requiring higher FiO2 needs and noninvasive mechanical ventilatory support and pulmonary critical care was asked to assume her care at this time.  Her gases indicate that she is a chronic PCO2 retainer with a normal pH and a PCO2 of 78.  PO2 was noted to be 68 on 30% FiO2.  She is on current interventions for COVID-19 and will be transferred to the intensive care unit for further evaluation and treatment  Pertinent  Medical History   Past Medical History:  Diagnosis Date   COPD (chronic obstructive pulmonary disease) ([facility])      Significant Hospital Events: Including procedures, antibiotic start and stop dates in addition to other pertinent events   Admitted with COVID-19 pneumonia  Interim History / Subjective:  66 year old female COVID-positive with pneumonia now with increasing FiO2 needs requiring noninvasive being transferred intensive care unit pulmonary critical care service  Objective   Blood pressure 124/68, pulse 76, temperature (!) 96.4 F (35.8 C), temperature source Axillary, resp. rate (!) 25, SpO2 95 %.    Vent Mode: PCV;BIPAP FiO2 (%):  [30 %] 30 % Set Rate:  [10 bmp] 10 bmp PEEP:  [8 cmH20] 8 cmH20   Intake/Output Summary (Last 24 hours) at 01/09/2022 1200 Last data filed at 01/09/2022 1133 Gross per 24 hour  Intake 100 ml  Output --  Net 100 ml   There were no vitals filed for this visit.  Examination: General: Elderly female currently on noninvasive mechanical ventilatory support with adequate oxygenation noted to have elevated PCO2 but is most likely normal for her with a normal pH and a PCO2 of 78 HENT: No JVD or  lymphadenopathy appreciated Lungs: Coarse rhonchi bilaterally Cardiovascular: Heart sounds are distant  Abdomen: soft positive bowel sounds Extremities: Without edema Neuro: None orientated to person place or time GU: Voids  Resolved Hospital Problem list     Assessment & Plan:  Acute on chronic hypercarbic hypoxic respiratory failure in the setting of COVID-19 pneumonia with underlying COPD and continued tobacco abuse. Transfer the intensive care unit for noninvasive mechanical ventilatory support as needed Continue current COVID treatment including Decadron and remdesivir. Continue low molecular weight heparin As needed noninvasive mechanical ventilatory support Stop NicoDerm Avoid intubation if possible  Altered mental status most likely secondary to acute illness and hypoxia Monitored in the intensive care unit  Best Practice (right click and "Reselect all SmartList Selections" daily)   Diet/type: NPO DVT prophylaxis:  GI prophylaxis: PPI Lines: N/A Foley:  N/A Code Status:  full code Last date of multidisciplinary goals of care discussion [tbd]  Labs   CBC: Recent Labs  Lab 01/08/22 0224 01/08/22 0546 01/09/22 0900 01/09/22 0917 01/09/22 1137  WBC 16.9*  --   --  19.1*  --   NEUTROABS 14.4*  --   --   --   --   HGB 12.5 13.3 11.9* 11.5* 11.6*  HCT 40.3 39.0 35.0* 37.2 34.0*  MCV 96.0  --   --  97.4  --   PLT 260  --   --  279  --     Basic Metabolic Panel: Recent Labs  Lab 01/08/22 0224 01/08/22 0546 01/09/22 0900 01/09/22 0917 01/09/22 1137  NA 135 137 139 139 140  K 3.7 3.7 4.1 5.4* 4.0  CL 97*  --   --  97*  --   CO2 25  --   --  31  --   GLUCOSE 181*  --   --  116*  --   BUN 23  --   --  25*  --   CREATININE 0.90  --   --  0.71  --   CALCIUM 8.7*  --   --  9.0  --   MG  --   --   --  2.4  --   PHOS  --   --   --  2.7  --    GFR: CrCl cannot be calculated (Unknown ideal weight.). Recent Labs  Lab 01/08/22 0224 01/08/22 0358  01/08/22 0435 01/08/22 0745 01/09/22 0917  PROCALCITON  --  1.12  --   --   --   WBC 16.9*  --   --   --  19.1*  LATICACIDVEN  --   --  1.8 1.3  --     Liver Function Tests: Recent Labs  Lab 01/08/22 0224 01/09/22 0917  AST 23 39  ALT 18 17  ALKPHOS 141* 153*  BILITOT 0.4 2.3*  PROT 7.0 6.3*  ALBUMIN 2.9* 2.6*   No results for input(s): LIPASE, AMYLASE in the last 168 hours. No results for input(s): AMMONIA in the last 168 hours.  ABG    Component Value Date/Time   PHART 7.361 01/09/2022 1137   PCO2ART 68.6 (HH) 01/09/2022 1137   PO2ART 71 (L) 01/09/2022 1137   HCO3 38.8 (H) 01/09/2022 1137   TCO2 41 (H) 01/09/2022 1137   O2SAT 92.0 01/09/2022 1137     Coagulation Profile: Recent Labs  Lab 01/08/22 0358  INR 1.3*    Cardiac Enzymes: No results for input(s): CKTOTAL, CKMB, CKMBINDEX, TROPONINI in the last 168 hours.  HbA1C: No results found for: HGBA1C  CBG: Recent Labs  Lab 01/08/22 1137 01/08/22 1645 01/08/22 2313 01/09/22 0845  GLUCAP 161* 150* 132* 130*    Review of Systems:   na  Past Medical History:  She,  has a past medical history of COPD (chronic obstructive pulmonary disease) ([facility]).   Surgical History:  History reviewed. No pertinent surgical history.   Social History:   reports that she has been smoking cigarettes. She does not have any smokeless tobacco history on file.   Family History:  Her family history is not on file.   Allergies No Known Allergies   Home Medications  Prior to Admission medications   Medication Sig Start Date End Date Taking? Authorizing Provider  aspirin EC 81 MG tablet Take 81 mg by mouth daily. Swallow whole.   Yes [provider]     Critical care time: 34 min   Brett Canales Reba Hulett ACNP Acute Care Nurse Practitioner [facility]Critical [facility] Please consult Amion 01/09/2022, 12:01 PM

## 2022-01-09 NOTE — Progress Notes (Addendum)
HD#1 SUBJECTIVE:  Patient Summary: Natalie Alexander is a 66 y.o. with a pertinent PMH of hypertension, obesity, and tobacco use disorder who presented with dyspnea and admitted for acute hypoxic respiratory failure secondary to COVID-19 pneumonia.   Overnight Events: No acute events overnight.  Interim History: This is hospital day 1 for [name] who was seen and evaluated at the bedside this morning. She is somnolent and unable to converse with the team this morning. She will awake to voice, however, unable to verbalize or follow commands at this time.   OBJECTIVE:  Vital Signs: Vitals:   01/09/22 0000 01/09/22 0145 01/09/22 0200 01/09/22 0500  BP: 115/76  (!) 156/86 134/64  Pulse: 86  85 85  Resp: 20  19 (!) 24  Temp:  98 F (36.7 C)    TempSrc:  Oral    SpO2: 94%  97% 95%   Supplemental O2:  BiPAP SpO2: 95 % FiO2 (%): 40 %  There were no vitals filed for this visit.  No intake or output data in the 24 hours ending 01/09/22 0643 Net IO Since Admission: 656.12 mL [01/09/22 0643]  Physical Exam: General: Chronically ill, somnolent female laying in bed.  CV: RRR. Systolic murmur appreciated Pulmonary: Diffuse crackles; on 6 L [place] with good oxygenation (later placed on BiPAP) Abdominal: Soft, nontender, nondistended.  Extremities: Palpable radial and DP pulses.  Neuro: Somnolent, occasionally will wake to voice. Unable to follow commands at this time.     ASSESSMENT/PLAN:  Assessment: Principal Problem:   Acute respiratory failure due to COVID-19 [facility])   Plan: #Acute hypoxic and hypercarbic respiratory failure 2/2 to COVID-19 pneumonia #Acute metabolic encephalopathy 2/2 hypercarbia Patient initially presented with dyspnea and required BiPAP support upon presentation to the ED. She was weaned down to 6 L [place] upon my evaluation yesterday afternoon, and initially appeared to be doing better. Today, the patient was again on 6 L [place], however, she was much more  somnolent and was unable to interact with the team during our evaluation. ABG was 7.28/78/80, consistent with hypcercarbic respiratory failure and the patient was then placed on BiPAP. Although she does not have formal PFTs done, suspect that she has some underlying COPD given her history of tobacco use disorder.  - PCCM consulted as patient requires ICU placement given her BiPAP requirement and COVID diagnosis  - Continue BiPAP support as needed; recheck ABG 2 hrs after BiPAP  - Continue remdesivir; consider baricitinib if clinical course does not improve - Will continue IV dexamethasone until patient able to switch to po decadron - Cough suppressants prn - Encourage ICS and flutter valve - Maintain SpO2 [id number]  #Diastolic dysfunction  #Demand ischemia  Troponin initially elevated to 349, reduced to 217 upon recheck. Likely secondary to demand ischemia in the setting of acute hypoxic respiratory failure. Echo this admission with EF of 60-65% and no wall motion abnormalities noted. There is moderate LVH and grade II diastolic dysfunction with mild aortic stenosis noted. Unable to assess symptoms, as patient is somnolent today, however, she does have crackles in her lung fields, which could be secondary to heart failure vs COVID pneumonia, although it is more likely from [place].  #Tobacco use disorder Patient smokes about 1.5 ppd.  - Nicotine patch ordered  #Hyperglycemia CBGs 130-160 over the last 24 hrs, A1c pending. Elevated CBGs could be secondary to IV steroids as above. - CBGs - SSI   Best Practice: Diet: Cardiac diet IVF: Fluids: none VTE: enoxaparin (  LOVENOX) injection 40 mg Start: 01/08/22 1800 Code: Full AB: none Therapy Recs: Pending Family Contact: Sharyn Lull, daughter,, to be notified. DISPO: Anticipated discharge pending Medical stability.  Signature: Buddy Duty, D.O.  Internal Medicine Resident, PGY-1 [facility]  Pager: [phone] 6:43  AM, 01/09/2022   Please contact the on call pager after 5 pm and on weekends at [phone].

## 2022-01-09 NOTE — ED Notes (Signed)
Pt continuously taking [place] O2 off. [place] reapplied and have attempted to remind the pt of the importance of keeping it on. RN made aware.

## 2022-01-09 NOTE — Progress Notes (Signed)
Pt placed back on NIV via servo I due to new agitation, AMS and ABG results listed below.    Latest Reference Range & Units 01/09/22 09:00  Sample type  ARTERIAL  pH, Arterial 7.350 - 7.450  7.283 (L)  pCO2 arterial 32.0 - 48.0 mmHg 78.5 (HH)  pO2, Arterial 83.0 - 108.0 mmHg 88  TCO2 22 - 32 mmol/L 39 (H)  Acid-Base Excess 0.0 - 2.0 mmol/L 8.0 (H)  Bicarbonate 20.0 - 28.0 mmol/L 37.1 (H)  O2 Saturation % 95.0  Sodium 135 - 145 mmol/L 139  Potassium 3.5 - 5.1 mmol/L 4.1  Calcium Ionized 1.15 - 1.40 mmol/L 1.25  Hemoglobin 12.0 - 15.0 g/dL 39.7 (L)  HCT 67.3 - 41.9 % 35.0 (L)  (HH): Data is critically high (L): Data is abnormally low (H): Data is abnormally high

## 2022-01-09 NOTE — Progress Notes (Signed)
Pt is now awake and able to follow commands. Bipap removed and pt placed on 6L [place] tolerating well. Transferred to 3M04 without any complications.

## 2022-01-09 NOTE — ED Notes (Signed)
PO meds held, pt being put back on BiPap

## 2022-01-09 NOTE — Progress Notes (Signed)
Patient has stated that she will not wear the BiPAP tonight.  RT advised patient that if her respiratory status begins to decline then she will need to wear the Bipap.  Patient stated she understood.

## 2022-01-09 NOTE — ED Notes (Signed)
Slight delay in transport to the floor requested.

## 2022-01-10 ENCOUNTER — Other Ambulatory Visit: Payer: Self-pay

## 2022-01-10 LAB — PHOSPHORUS: Phosphorus: 2.3 mg/dL — ABNORMAL LOW (ref 2.5–4.6)

## 2022-01-10 LAB — CBC
HCT: 38.5 % (ref 36.0–46.0)
Hemoglobin: 12 g/dL (ref 12.0–15.0)
MCH: 29.9 pg (ref 26.0–34.0)
MCHC: 31.2 g/dL (ref 30.0–36.0)
MCV: 96 fL (ref 80.0–100.0)
Platelets: 264 10*3/uL (ref 150–400)
RBC: 4.01 MIL/uL (ref 3.87–5.11)
RDW: 15.5 % (ref 11.5–15.5)
WBC: 11 10*3/uL — ABNORMAL HIGH (ref 4.0–10.5)
nRBC: 0.4 % — ABNORMAL HIGH (ref 0.0–0.2)

## 2022-01-10 LAB — COMPREHENSIVE METABOLIC PANEL
ALT: 22 U/L (ref 0–44)
AST: 19 U/L (ref 15–41)
Albumin: 2.6 g/dL — ABNORMAL LOW (ref 3.5–5.0)
Alkaline Phosphatase: 128 U/L — ABNORMAL HIGH (ref 38–126)
Anion gap: 10 (ref 5–15)
BUN: 24 mg/dL — ABNORMAL HIGH (ref 8–23)
CO2: 35 mmol/L — ABNORMAL HIGH (ref 22–32)
Calcium: 8.8 mg/dL — ABNORMAL LOW (ref 8.9–10.3)
Chloride: 99 mmol/L (ref 98–111)
Creatinine, Ser: 0.58 mg/dL (ref 0.44–1.00)
GFR, Estimated: >60 mL/min (ref >60)
Glucose, Bld: 155 mg/dL — ABNORMAL HIGH (ref 70–99)
Potassium: 4.1 mmol/L (ref 3.5–5.1)
Sodium: 144 mmol/L (ref 135–145)
Total Bilirubin: 0.6 mg/dL (ref 0.3–1.2)
Total Protein: 6.6 g/dL (ref 6.5–8.1)

## 2022-01-10 LAB — HEMOGLOBIN A1C
Hgb A1c MFr Bld: 6.6 % — ABNORMAL HIGH (ref 4.8–5.6)
Mean Plasma Glucose: 142.72 mg/dL

## 2022-01-10 LAB — LACTATE DEHYDROGENASE: LDH: 169 U/L (ref 98–192)

## 2022-01-10 LAB — C-REACTIVE PROTEIN: CRP: 24.7 mg/dL — ABNORMAL HIGH (ref <1.0)

## 2022-01-10 LAB — D-DIMER, QUANTITATIVE: D-Dimer, Quant: 2.17 ug/mL-FEU — ABNORMAL HIGH (ref 0.00–0.50)

## 2022-01-10 LAB — MAGNESIUM: Magnesium: 2.4 mg/dL (ref 1.7–2.4)

## 2022-01-10 LAB — GLUCOSE, CAPILLARY
Glucose-Capillary: 121 mg/dL — ABNORMAL HIGH (ref 70–99)
Glucose-Capillary: 160 mg/dL — ABNORMAL HIGH (ref 70–99)
Glucose-Capillary: 168 mg/dL — ABNORMAL HIGH (ref 70–99)
Glucose-Capillary: 98 mg/dL (ref 70–99)

## 2022-01-10 LAB — MRSA NEXT GEN BY PCR, NASAL: MRSA by PCR Next Gen: NOT DETECTED

## 2022-01-10 MED ORDER — NICOTINE 7 MG/24HR TD PT24
7.0000 mg | MEDICATED_PATCH | Freq: Every day | TRANSDERMAL | Status: DC
  Administered 2022-01-10 – 2022-01-11 (×2): 7 mg via TRANSDERMAL
  Filled 2022-01-10 (×3): qty 1

## 2022-01-10 MED ORDER — K PHOS MONO-SOD PHOS DI & MONO 155-852-130 MG PO TABS
500.0000 mg | ORAL_TABLET | ORAL | Status: AC
  Administered 2022-01-10 (×2): 500 mg via ORAL
  Filled 2022-01-10 (×2): qty 2

## 2022-01-10 NOTE — Evaluation (Signed)
Physical Therapy Evaluation Patient Details Name: Natalie Alexander MRN: [id number] DOB: [birth year] Today's Date: 01/10/2022  History of Present Illness  Pt is a 66 y/o female admitted for PNA due to COVID-19 with new need for supplemental O2. Pt also noted with acute metabolic encephalopathy. PMH: COPD  Clinical Impression  Pt admitted with/for PNA due to COVID.  Pt presently deconditioned, needing O2 and min guard assist overall.  Pt currently limited functionally due to the problems listed below.  (see problems list.)  Pt will benefit from PT to maximize function and safety to be able to get home safely with available assist.        Recommendations for follow up therapy are one component of a multi-disciplinary discharge planning process, led by the attending physician.  Recommendations may be updated based on patient status, additional functional criteria and insurance authorization.  Follow Up Recommendations No PT follow up    Assistance Recommended at Discharge Intermittent Supervision/Assistance  Patient can return home with the following  A little help with walking and/or transfers;A little help with bathing/dressing/bathroom    Equipment Recommendations None recommended by PT (TBD based on progress)  Recommendations for Other Services       Functional Status Assessment Patient has had a recent decline in their functional status and demonstrates the ability to make significant improvements in function in a reasonable and predictable amount of time.     Precautions / Restrictions Precautions Precautions: Fall;Other (comment) Precaution Comments: monitor O2 (does not wear at baseline)      Mobility  Bed Mobility Overal bed mobility: Needs Assistance Bed Mobility: Supine to Sit;Sit to Supine     Supine to sit: Supervision Sit to supine: Supervision   General bed mobility comments: HOB raised, but pt moved in/out of bed with ease    Transfers Overall transfer  level: Needs assistance Equipment used: None Transfers: Sit to/from Stand Sit to Stand: Supervision           General transfer comment: used UE appropriately, steadied herself with legs on bedframe initially    Ambulation/Gait Ambulation/Gait assistance: Min guard Gait Distance (Feet): 35 Feet Assistive device: IV Pole Gait Pattern/deviations: Step-through pattern   Gait velocity interpretation: <1.8 ft/sec, indicate of risk for recurrent falls   General Gait Details: generally steady, but getting tangled in and running IV pole into all obstacles.  Stairs            Wheelchair Mobility    Modified Rankin (Stroke Patients Only)       Balance Overall balance assessment: Needs assistance Sitting-balance support: Feet supported;No upper extremity supported Sitting balance-Leahy Scale: Good     Standing balance support: No upper extremity supported;During functional activity Standing balance-Leahy Scale: Fair                               Pertinent Vitals/Pain      Home Living Family/patient expects to be discharged to:: Private residence Living Arrangements: Children;Other relatives Available Help at Discharge: Family (close to 24/7) Type of Home: House Home Access: Level entry       Home Layout: Two level;Able to live on main level with bedroom/bathroom;1/2 bath on main level Home Equipment: None Additional Comments: daughter works but 54 y/o granddaughter also in the home    Prior Function Prior Level of Function : Independent/Modified Independent;Driving             Mobility Comments: no use of  AD, denies any falls ADLs Comments: Independent with ADLs, IADLs, walks small dogs. 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 [employer]   Dominant Hand: Right    Extremity/Trunk Assessment   Upper Extremity Assessment Upper Extremity Assessment: Overall WFL for tasks assessed    Lower Extremity Assessment Lower Extremity  Assessment: Overall WFL for tasks assessed (general proximal weakness from inactivity)    Cervical / Trunk Assessment Cervical / Trunk Assessment: Normal  Communication   Communication: No difficulties  Cognition Arousal/Alertness: Awake/alert Behavior During Therapy: WFL for tasks assessed/performed;Impulsive Overall Cognitive Status: Impaired/Different from baseline Area of Impairment: Safety/judgement;Awareness;Attention                   Current Attention Level: Selective     Safety/Judgement: Decreased awareness of deficits Awareness: Emergent   General Comments: Tangential conversation at times, easily distracted. pleasant and witty though impulsive with movements at times. answers orientation questions appropriately and follows directions consistently        General Comments General comments (skin integrity, edema, etc.): Attempted ambulation in room 1 trial without O2 and sats dropped to 86%, 30 secs to return to 91% on 3l [place].  HR in the 100's to low 110's overall.    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 PT Goal Formulation: With patient Time For Goal Achievement: 01/24/22 Potential to Achieve Goals: Good    Frequency Min 3X/week     Co-evaluation               AM-PAC PT "6 Clicks" Mobility  Outcome Measure Help needed turning from your back to your side while in a flat bed without using bedrails?: A Little Help needed  moving from lying on your back to sitting on the side of a flat bed without using bedrails?: A Little Help needed moving to and from a bed to a chair (including a wheelchair)?: A Little Help needed standing up from a chair using your arms (e.g., wheelchair or bedside chair)?: A Little Help needed to walk in hospital room?: A Little Help needed climbing 3-5 steps with a railing? : A Little 6 Click Score: 18    End of Session Equipment Utilized During Treatment: Oxygen Activity Tolerance: Patient tolerated treatment well Patient left: in bed;with call bell/phone within reach;with bed alarm set;with family/visitor present Nurse Communication: Mobility status PT Visit Diagnosis: Other abnormalities of gait and mobility (R26.89);Difficulty in walking, not elsewhere classified (R26.2)    Time: 4650-3546 PT Time Calculation (min) (ACUTE ONLY): 20 min   Charges:   PT Evaluation $PT Eval Moderate Complexity: 1 Mod          01/10/2022  Jacinto Halim., PT Acute Rehabilitation Services [phone]  (pager) [phone]  (office)  Eliseo Gum Hieu Herms 01/10/2022, 7:28 PM

## 2022-01-10 NOTE — Progress Notes (Addendum)
Patients HR dropping into SB at low as mid 40's. Patient having frequent ectopy. Writer performed EKG. EKG abnormal  reading as follows:  Sinus bradycardia with short PR with premature atrial complexes. Moderate voltage criteria for LVH may be normal variant (R in aVL [place] product) ST and T wave abnormality, consider anterolateral ischemia.  Abnormal ECG.  E-Link made aware.

## 2022-01-10 NOTE — Progress Notes (Addendum)
° °  NAME:  Natalie Alexander, MRN:  [id number], DOB:  11-03-1956, LOS: 2 ADMISSION DATE:  01/08/2022, CONSULTATION DATE: 01/09/2022 REFERRING MD: Internal medicine, CHIEF COMPLAINT: COVID-19 with respiratory failure  History of Present Illness:  66 year old female smoker who is admitted with COVID-19 and radiographic evidence of pneumonia.  She began requiring higher FiO2 needs and noninvasive mechanical ventilatory support and pulmonary critical care was asked to assume her care at this time.  Her gases indicate that she is a chronic PCO2 retainer with a normal pH and a PCO2 of 78.  PO2 was noted to be 68 on 30% FiO2.  She is on current interventions for COVID-19 and will be transferred to the intensive care unit for further evaluation and treatment  Pertinent  Medical History   Past Medical History:  Diagnosis Date   COPD (chronic obstructive pulmonary disease) (HCC)      Significant Hospital Events: Including procedures, antibiotic start and stop dates in addition to other pertinent events   Admitted with COVID-19 pneumonia  Interim History / Subjective:  Did not wear BiPAP overnight.  Currently on 4L O2 via [place] and doing well.  Feels much better.  Asking for food. Vitals stable.  Objective   Blood pressure (!) 147/78, pulse 80, temperature 98.2 F (36.8 C), temperature source Oral, resp. rate (!) 22, weight 73.7 kg, SpO2 91 %.    Vent Mode: PCV;BIPAP FiO2 (%):  [30 %] 30 % Set Rate:  [10 bmp] 10 bmp PEEP:  [8 cmH20] 8 cmH20   Intake/Output Summary (Last 24 hours) at 01/10/2022 0721 Last data filed at 01/10/2022 0600 Gross per 24 hour  Intake 204.42 ml  Output 650 ml  Net -445.58 ml    Filed Weights   01/09/22 1502  Weight: 73.7 kg    Examination:  General: Elderly female, watching TV, in NAD HENT: [place]/AT.  MMM. Lungs: Faint rhonchi bilaterally Cardiovascular: RRR, no M/R/G Abdomen: BS x 4, S/NT/ND Extremities: Without edema Neuro: A&O x 3, no deficits   Assessment &  Plan:   Acute on chronic hypercarbic hypoxic respiratory failure in the setting of COVID-19 pneumonia with underlying COPD and continued tobacco abuse.  She is s/p Actemra 1/10. - Continue supplemental O2 as needed to maintain SpO2 > 88% - NIMV as needed (has not required overnight or this AM) - Continue Solumedrol, BD's - No role for Remdesivir - Avoid intubation if possible  Altered mental status most likely secondary to acute illness and hypoxia - improved. - Continue supportive care.  OK to transfer out of ICU as long as continues to be stable on [place] and not require NIMV.  Will ask TR[facility] to assume care in AM 1/12 with PCCM off at the time.  Best Practice (right click and "Reselect all SmartList Selections" daily)   Diet/type: Regular consistency (see orders) DVT prophylaxis: Lovenox GI prophylaxis: N/A Lines: N/A Foley:  N/A Code Status:  full code Last date of multidisciplinary goals of care discussion [tbd]  Critical care time: N/A    Rutherford Guys, PA - [facility] For pager details, please see AMION or use Epic chat  After 1900, please call ELINK for cross coverage needs 01/10/2022, 8:21 AM

## 2022-01-10 NOTE — Evaluation (Signed)
Occupational Therapy Evaluation Patient Details Name: Natalie Alexander MRN: [id number] DOB: 1956/04/26 Today's Date: 01/10/2022   History of Present Illness Pt is a 66 y/o female admitted for PNA due to COVID-19 with new need for supplemental O2. Pt also noted with acute metabolic encephalopathy. PMH: COPD   Clinical Impression   PTA, pt lives with family, reports independence in all daily tasks and currently working as a Administrator. Pt presents now with minor deficits in dynamic standing balance and cardiopulmonary tolerance with new supplemental O2 needs to maintain sats with activity. Pt requires Setup for UB ADLs, min guard for LB ADLs and min guard for short mobility without AD. Pt noted to reach out for support with standing tasks but anticipate this will improve with increased activity. Began education on energy conservation strategies with focus on pursed lip breathing. Plan to provide UE HEP in next session to maximize overall strength. Anticipate no OT needs at DC but will continue to follow acutely to address established OT goals.  SpO2 94% at rest on 4 L O2, desats to 86% with activity HR sustained low 100s with activity     Recommendations for follow up therapy are one component of a multi-disciplinary discharge planning process, led by the attending physician.  Recommendations may be updated based on patient status, additional functional criteria and insurance authorization.   Follow Up Recommendations  No OT follow up    Assistance Recommended at Discharge Set up Supervision/Assistance  Patient can return home with the following A little help with bathing/dressing/bathroom;Assistance with cooking/housework;Help with stairs or ramp for entrance    Functional Status Assessment  Patient has had a recent decline in their functional status and demonstrates the ability to make significant improvements in function in a reasonable and predictable amount of time.  Equipment  Recommendations  None recommended by OT    Recommendations for Other Services       Precautions / Restrictions Precautions Precautions: Fall;Other (comment) Precaution Comments: monitor O2 (does not wear at baseline) Restrictions Weight Bearing Restrictions: No      Mobility Bed Mobility               General bed mobility comments: up in recliner    Transfers Overall transfer level: Needs assistance Equipment used: None Transfers: Sit to/from Stand Sit to Stand: Supervision           General transfer comment: sway with initial standing, able to correct balance      Balance Overall balance assessment: Needs assistance Sitting-balance support: Feet supported;No upper extremity supported Sitting balance-Leahy Scale: Good     Standing balance support: No upper extremity supported;During functional activity Standing balance-Leahy Scale: Fair Standing balance comment: able to mobilize without AD short distances though noted to reach out to furniture/IV pole for support                           ADL either performed or assessed with clinical judgement   ADL Overall ADL's : Needs assistance/impaired Eating/Feeding: Independent;Sitting   Grooming: Supervision/safety;Standing   Upper Body Bathing: Set up;Sitting   Lower Body Bathing: Min guard;Sit to/from stand   Upper Body Dressing : Set up;Sitting   Lower Body Dressing: Min guard;Sit to/from stand   Toilet Transfer: Min guard;Ambulation   Toileting- Clothing Manipulation and Hygiene: Min guard;Sit to/from stand       Functional mobility during ADLs: Min guard General ADL Comments: Minor deficits in cardiopulmonary tolerance and dynamic  standing balance. reaching out for support during mobility. Began education on energy conservation strategies, breathing techniques     Vision Ability to See in Adequate Light: 0 Adequate Patient Visual Report: No change from baseline Vision Assessment?:  No apparent visual deficits     Perception     Praxis      Pertinent Vitals/Pain Pain Assessment: No/denies pain     Hand Dominance Right   Extremity/Trunk Assessment Upper Extremity Assessment Upper Extremity Assessment: Overall WFL for tasks assessed   Lower Extremity Assessment Lower Extremity Assessment: Defer to PT evaluation   Cervical / Trunk Assessment Cervical / Trunk Assessment: Normal   Communication Communication Communication: No difficulties   Cognition Arousal/Alertness: Awake/alert Behavior During Therapy: WFL for tasks assessed/performed;Impulsive Overall Cognitive Status: Impaired/Different from baseline Area of Impairment: Safety/judgement;Awareness;Attention                   Current Attention Level: Selective     Safety/Judgement: Decreased awareness of deficits Awareness: Emergent   General Comments: tangential conversation at times, easily distracted. pleasant and witty though impulsive with movements at times. answers orientation questions appropriately and follows directions consistently     General Comments  Desats to 86-87% on 4 L O2 with mobility in room. brief HR up to 146 but quickly back down to low 100s with first bout of mobility, no increaed HR with second bout. 2 grandchildren present during session    Exercises     Shoulder Instructions      Home Living Family/patient expects to be discharged to:: Private residence Living Arrangements: Children;Other relatives Available Help at Discharge: Family (close to 24/7) Type of Home: House Home Access: Level entry     Home Layout: Two level;Able to live on main level with bedroom/bathroom;1/2 bath on main level     Bathroom Shower/Tub: Teacher, early years/pre: Standard     Home Equipment: None   Additional Comments: daughter works but 20 y/o granddaughter also in the home      Prior Functioning/Environment Prior Level of Function : Independent/Modified  Independent;Driving             Mobility Comments: no use of AD, denies any falls ADLs Comments: Independent with ADLs, IADLs, walks small dogs. 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[employer]        OT Problem List: Decreased strength;Decreased activity tolerance;Impaired balance (sitting and/or standing);Decreased cognition;Decreased knowledge of use of DME or AE;Cardiopulmonary status limiting activity      OT Treatment/Interventions: Self-care/ADL training;Therapeutic exercise;Energy conservation;DME and/or AE instruction;Therapeutic activities;Patient/family education;Balance training    OT Goals(Current goals can be found in the care plan section) Acute Rehab OT Goals Patient Stated Goal: go home soon, get back to work OT Goal Formulation: With patient Time For Goal Achievement: 01/24/22 Potential to Achieve Goals: Good  OT Frequency: Min 2X/week    Co-evaluation              AM-PAC OT "6 Clicks" Daily Activity     Outcome Measure Help from another person eating meals?: None Help from another person taking care of personal grooming?: A Little Help from another person toileting, which includes using toliet, bedpan, or urinal?: A Little Help from another person bathing (including washing, rinsing, drying)?: A Little Help from another person to put on and taking off regular upper body clothing?: A Little Help from another person to put on and taking off regular lower body clothing?: A Little 6 Click Score: 19   End  of Session Equipment Utilized During Treatment: Oxygen Nurse Communication: Mobility status  Activity Tolerance: Patient tolerated treatment well Patient left: in chair;with call bell/phone within reach;with chair alarm set;with family/visitor present  OT Visit Diagnosis: Other abnormalities of gait and mobility (R26.89);Muscle weakness (generalized) (M62.81)                Time: [id number] OT Time Calculation (min): 32 min Charges:  OT General Charges $OT Visit:  1 Visit OT Evaluation $OT Eval Moderate Complexity: 1 Mod OT Treatments $Therapeutic Activity: 8-22 mins  Malachy Chamber, OTR/L Acute Rehab Services Office: [phone]   Layla Maw 01/10/2022, 11:58 AM

## 2022-01-11 DIAGNOSIS — J9601 Acute respiratory failure with hypoxia: Secondary | ICD-10-CM

## 2022-01-11 DIAGNOSIS — I10 Essential (primary) hypertension: Secondary | ICD-10-CM

## 2022-01-11 DIAGNOSIS — R739 Hyperglycemia, unspecified: Secondary | ICD-10-CM

## 2022-01-11 DIAGNOSIS — F17210 Nicotine dependence, cigarettes, uncomplicated: Secondary | ICD-10-CM

## 2022-01-11 DIAGNOSIS — J9602 Acute respiratory failure with hypercapnia: Secondary | ICD-10-CM

## 2022-01-11 LAB — CBC
HCT: 38.8 % (ref 36.0–46.0)
Hemoglobin: 12.3 g/dL (ref 12.0–15.0)
MCH: 29.5 pg (ref 26.0–34.0)
MCHC: 31.7 g/dL (ref 30.0–36.0)
MCV: 93 fL (ref 80.0–100.0)
Platelets: 308 10*3/uL (ref 150–400)
RBC: 4.17 MIL/uL (ref 3.87–5.11)
RDW: 15 % (ref 11.5–15.5)
WBC: 17.7 10*3/uL — ABNORMAL HIGH (ref 4.0–10.5)
nRBC: 0.1 % (ref 0.0–0.2)

## 2022-01-11 LAB — BASIC METABOLIC PANEL
Anion gap: 12 (ref 5–15)
Anion gap: 14 (ref 5–15)
BUN: 23 mg/dL (ref 8–23)
BUN: 21 mg/dL (ref 8–23)
CO2: 33 mmol/L — ABNORMAL HIGH (ref 22–32)
CO2: 31 mmol/L (ref 22–32)
Calcium: 8.7 mg/dL — ABNORMAL LOW (ref 8.9–10.3)
Calcium: 8.8 mg/dL — ABNORMAL LOW (ref 8.9–10.3)
Chloride: 97 mmol/L — ABNORMAL LOW (ref 98–111)
Chloride: 98 mmol/L (ref 98–111)
Creatinine, Ser: 0.63 mg/dL (ref 0.44–1.00)
Creatinine, Ser: 0.69 mg/dL (ref 0.44–1.00)
GFR, Estimated: >60 mL/min (ref >60)
GFR, Estimated: >60 mL/min (ref >60)
Glucose, Bld: 121 mg/dL — ABNORMAL HIGH (ref 70–99)
Glucose, Bld: 120 mg/dL — ABNORMAL HIGH (ref 70–99)
Potassium: 3.9 mmol/L (ref 3.5–5.1)
Potassium: 4 mmol/L (ref 3.5–5.1)
Sodium: 142 mmol/L (ref 135–145)
Sodium: 143 mmol/L (ref 135–145)

## 2022-01-11 LAB — GLUCOSE, CAPILLARY
Glucose-Capillary: 109 mg/dL — ABNORMAL HIGH (ref 70–99)
Glucose-Capillary: 146 mg/dL — ABNORMAL HIGH (ref 70–99)
Glucose-Capillary: 196 mg/dL — ABNORMAL HIGH (ref 70–99)
Glucose-Capillary: 122 mg/dL — ABNORMAL HIGH (ref 70–99)

## 2022-01-11 LAB — PHOSPHORUS: Phosphorus: 3.1 mg/dL (ref 2.5–4.6)

## 2022-01-11 LAB — MAGNESIUM
Magnesium: 2.2 mg/dL (ref 1.7–2.4)
Magnesium: 2.3 mg/dL (ref 1.7–2.4)

## 2022-01-11 MED ORDER — PREDNISONE 20 MG PO TABS
40.0000 mg | ORAL_TABLET | Freq: Every day | ORAL | Status: DC
  Administered 2022-01-12: 40 mg via ORAL
  Filled 2022-01-11: qty 2

## 2022-01-11 NOTE — Progress Notes (Signed)
° °  HD#3 SUBJECTIVE:  Patient Summary: Natalie Alexander is a 66 y.o. with a pertinent PMH of hypertension, obesity, and tobacco use disorder who presented with dyspnea and admitted for acute hypoxic and hypercarbic respiratory failure secondary to COVID-19 pneumonia.   Overnight Events: No acute events overnight. Patient stable for transfer out of ICU   Interim History: This is Alexander day 3 for Natalie Alexander who was seen and evaluated at the bedside this morning. States her breathing has improved. She has some coughing but it is nonproductive. Patient eager to leave the ICU.    OBJECTIVE:  Vital Signs: Vitals:   01/11/22 0000 01/11/22 0355 01/11/22 0400 01/11/22 0423  BP: 135/88  (!) 196/76   Pulse: (!) 58  66   Resp: (!) 27  (!) 26   Temp:  98.2 F (36.8 C)    TempSrc:      SpO2: 96%  96%   Weight:    73 kg   Supplemental O2: Nasal Cannula SpO2: 96 % O2 Flow Rate (L/min): 4 L/min FiO2 (%): 30 %  Filed Weights   01/09/22 1502 01/11/22 0423  Weight: 73.7 kg 73 kg     Intake/Output Summary (Last 24 hours) at 01/11/2022 9169 Last data filed at 01/11/2022 0600 Gross per 24 hour  Intake 480 ml  Output 500 ml  Net -20 ml   Net IO Since Admission: 190.54 mL [01/11/22 0621]  Physical Exam: General: Resting comfortably in bed. No acute distress. CV: RRR. No murmurs. Pulmonary: Lungs CTAB. Normal effort on 2 L. Abdominal: Soft, nontender, nondistended.  Extremities: Normal bulk and tone Skin: Warm and dry.  Neuro: A&Ox3.  No focal deficit. Psych: Anxious mood    ASSESSMENT/PLAN:  Assessment: Principal Problem:   Pneumonia due to COVID-19 virus Active Problems:   Essential hypertension   Acute respiratory failure with hypoxia and hypercarbia (HCC)   Plan: #Acute hypoxic and hypercarbia respiratory failure secondary to COVID-19 pneumonia #Acute metabolic encephalopathy secondary to hypercarbia, resolved Patient initially with acute metabolic encephalopathy  secondary to hypercarbia and required BiPAP support and was transferred to the ICU for a day. ABG at that time was 7.28/78/80. Patient able to be weaned off of BiPAP and transitioned to nasal cannula, with significant improvement in her mental status. Patient was transitioned from decadron and remdesivir to solumedrol and tocilizumab. Received 1 dose of tocilizumab. - Continue solumedrol 80 mg today (3 days total) and then taper with prednisone tomorrow - Continue brovana/pulmicort two times daily  - BiPAP prn - Robitussin prn - Albuterol q2h prn - Will need outpatient PFTs and pulmonology follow up - Likely will need home O2 on discharge  #Tobacco use disorder Counseled on smoking cessation. Consider Chantix at discharge.   #Hyperglycemia A1c 6.6, borderline diabetic. CBGs 120-168 in the last 24 hrs.   Best Practice: Diet: Cardiac diet IVF: Fluids: none VTE: enoxaparin (LOVENOX) injection 40 mg Start: 01/08/22 1800 Code: Full AB: none Therapy Recs: None, DME: none DISPO: Anticipated discharge  in 1-3 days  to Home pending Medical stability and new supplemental O2.  Signature: Elza Rafter, D.O.  Internal Medicine Resident, PGY-1 [facility]  Pager: [phone] 6:21 AM, 01/11/2022   Please contact the on call pager after 5 pm and on weekends at [phone].

## 2022-01-12 ENCOUNTER — Other Ambulatory Visit (HOSPITAL_COMMUNITY): Payer: Self-pay

## 2022-01-12 LAB — CBC
HCT: 41.8 % (ref 36.0–46.0)
Hemoglobin: 13.5 g/dL (ref 12.0–15.0)
MCH: 29.7 pg (ref 26.0–34.0)
MCHC: 32.3 g/dL (ref 30.0–36.0)
MCV: 92.1 fL (ref 80.0–100.0)
Platelets: 327 10*3/uL (ref 150–400)
RBC: 4.54 MIL/uL (ref 3.87–5.11)
RDW: 14.7 % (ref 11.5–15.5)
WBC: 13.9 10*3/uL — ABNORMAL HIGH (ref 4.0–10.5)
nRBC: 0 % (ref 0.0–0.2)

## 2022-01-12 LAB — GLUCOSE, CAPILLARY
Glucose-Capillary: 96 mg/dL (ref 70–99)
Glucose-Capillary: 170 mg/dL — ABNORMAL HIGH (ref 70–99)

## 2022-01-12 LAB — PHOSPHORUS: Phosphorus: 2.9 mg/dL (ref 2.5–4.6)

## 2022-01-12 LAB — MAGNESIUM: Magnesium: 2.4 mg/dL (ref 1.7–2.4)

## 2022-01-12 MED ORDER — DEXTROMETHORPHAN-GUAIFENESIN 10-100 MG/5ML PO SYRP
10.0000 mL | ORAL_SOLUTION | ORAL | 0 refills | Status: DC | PRN
  Filled 2022-01-12: qty 118, 2d supply, fill #0

## 2022-01-12 MED ORDER — PREDNISONE 20 MG PO TABS
ORAL_TABLET | ORAL | 0 refills | Status: AC
  Filled 2022-01-12: qty 9, 8d supply, fill #0

## 2022-01-12 NOTE — Discharge Instructions (Addendum)
Dear Natalie Alexander, You were hospitalized for COVID-pneumonia.  You were treated with steroids and an antiviral medication which helped improve your symptoms.  I am glad you are feeling much better!  You will need to take steroids for a few more days to complete this treatment- please take them as followed:   Prednisone 40 mg for 2 days Prednisone 20 mg for 3 days Prednisone 10 mg for 3 days  Please make an appointment to follow-up with your primary care physician in 2 weeks for hospital follow-up appointment.  You will also need a referral to a pulmonologist (lung doctor) as we suspect you may have COPD.  It is very important to quit smoking, especially while you are using your home supplemental oxygen.   Hypertension Nutrition Therapy  This diet will help lower your blood pressure, which can reduce the chances that you will have a heart attack or stroke. This nutrition therapy is sometimes called the DASH (Dietary Approaches to Stop Hypertension) plan. It cuts back on the amount of sodium that you get from food and drink. (Most sodium comes from salt.)  Limiting Sodium Even if you are taking medications for your blood pressure, you should still limit how much sodium you consume. In general, people with high blood pressure should get between 1,500 milligrams (mg) and 2,300 mg sodium per day. Your doctor or dietitian can tell you the specific limit that is right for you.  Tips to Cut Sodium In general, foods with more than 300 mg sodium per serving may not fit into your meal plan. You can find out how much sodium is in a food by reading the food label. Remember the amount listed is for one serving, which may be more or less than you eat.) Do not salt food at the table; add very little when cooking. Choose carefully when you eat away from home. Restaurant foods can be very high in sodium. Let the person taking your order know that you are looking for low-salt or no-salt choices. Many restaurants  have special menus or will make food with less salt.  Fats Eating the right types of fat and avoiding the unhealthy ones helps to reduce the buildup of plaque in your blood vessels. This lowers your risk for strokes or heart attacks. This eating plan includes heart-healthy kinds of fat. However, it limits saturated and trans fats. (See the chart for foods with each type of fat.)  Type of Fat Foods With This Type of Fat Heart Healthy?  Unsaturated fats Soybean, canola, olive, or sunflower oil Liquid or soft tub margarines Yes  Omega-3 fatty acids Fatty coldwater fish, such as salmon, tuna, mackerel, and sardines Flaxseed oil and ground flaxseed Yes  Saturated fats Foods with fat from animals (such as fatty meats, whole milk, butter, cream, and other dairy foods made with whole milk) Palm, palm kernel, or coconut oil (tropical oils) No  Trans fats All foods made with hydrogenated oil (Read Nutrition Facts labels: hydrogenated oil may be found in fried foods, crackers, chips, and baked goods made with margarine or shortening.) No    Maintaining a Healthy Weight If you need to lose weight, the DASH plan can help you because it limits high-fat foods and refined carbohydrates. These foods can be high in calories but dont contain many healthy nutrients. Talk to your doctor about what a healthy weight is for you, and set goals to reach that weight. Your doctor can also help you make a plan to get regular  physical exercise.   Food Group Foods Recommended  Grains Breads and cereals, especially those made with whole grains such as oats, barley, rye, or whole wheat Pasta, especially when made with whole grains Brown rice Low-fat, low-sodium crackers and pretzels  Vegetables Fresh, frozen, or canned vegetables without added fat or salt Highly colored vegetables, such as broccoli, greens, sweet potatoes, and tomatoes are especially good for you  Fruits Fresh, frozen, canned, or dried fruit  Milk and  Milk Products Nonfat (skim), % fat or 1% fat milk Nonfat or low-fat yogurt Nonfat, low-sodium cottage cheese Fat-free and low-fat, low-sodium cheese  Meat and Other Protein Foods Fish (especially fatty fish, such as salmon, fresh tuna, or mackerel) Lean cuts of beef and pork (loin, leg, round, extra lean hamburger) Low-sodium cold cuts made with lean meat or soy protein Skinless Investment banker, corporatepoultry Venison and other wild game Unsalted nuts and nut butters Dried beans and peas Low-sodium meat alternatives made with soy or textured vegetable protein Egg whites or egg substitute  Fats and Oils Unsaturated oils (soybean, olive, canola, sunflower, safflower) Soft or liquid margarines and vegetable oil spreads Salad dressings (nonfat or made with unsaturated oil) Seeds Avocado  Other Herbs and spices to add flavor to replace salt Unsalted, low-fat snack foods, such as unsalted pretzels or plain popcorn Fat-free or low-fat sweets, such as maple syrup, jelly beans, hard candy, or sorbet      Food Group Foods Not Recommended  Grains Baked goods made with hydrogenated fat or saturated fat Any grain foods that are high in sodium or added sugar  Vegetables Canned vegetables (unless they are low sodium or salt free) Pickles; vegetables packed in brine, such as sauerkraut or olives Fried or breaded vegetables; vegetables in cream or butter sauces  Fruits Fried fruits; fruits in cream or butter sauces  Milk and Milk Products Whole and 2% fat milk; cream Cheese (except for nonfat or low-fat, low-sodium types) Processed cheese products Foods made from whole milk or cream (such as ice cream or half-and-half)  Meat and Other Protein Foods Canned or smoked meat or fish Marbled or fatty meats (such as bacon, sausage, hot dogs, regular hamburger) Whole eggs and egg yolks Poultry with skin High-sodium lunch or deli meats (such as salami) Canned beans (unless they are low-sodium or salt-free)  Fats and Oils  Solid cooking fats (shortening, butter, stick margarine) Tropical oils (palm, palm kernel, or coconut oil)  Other Salt, seasoning mixes made with salt Soy sauce, miso Canned or dried soups (except for low-fat, low-sodium types) Bouillon cubes Catsup, barbeque sauce, worcestershire sauce Jarred or bottled salsa (homemade without salt is fine) Sugary drinks (such as soda or fruit drinks) Snack foods made with hydrogenated oil, shortening, or butter High-sodium snack foods (chips, pretzels, salted nuts) High-fat, high-sugar desserts High-fat gravies and sauces Premade foods (boxed pasta mixes, frozen dinners, and so on) if high in sodium or fat  Alcohol Women: No more than 1 drink per day. Men: No more than 2 drinks per day. (1 drink = 5 ounces [oz] wine, 12 oz beer, or 1 oz liquor)    Hypertension (High Blood Pressure) Sample 1-Day Menu Breakfast 6 oz orange juice 1 cup raisin bran cereal 1/8 cup raisins 1 cup low-fat milk  Lunch 3/4 cup chopped chicken breast 1 tablespoon low-fat mayonnaise 1/2 large pita bread 3 sticks carrots 2 radishes 2 lettuce leaves 1 cup low-fat fruit yogurt 1 cup iced tea, unsweetened  Evening Meal 4 oz herb baked fish 1/2  cup steamed broccoli, with pasta 1/2 cup stewed tomatoes, with pasta 1/2 cup bell peppers, with pasta 1/2 cup raw spinach, salad 1/4 cup cherry tomatoes, salad 1 tablespoon slivered almonds, salad 1 tablespoon light Italian salad dressing 1 whole wheat dinner roll 1 teaspoon margarine 1 cantaloupe wedge 1 cup water, with lemon wedge  Evening Snack 1 banana 1/2 cup light ice cream 1/2 cup fat-free (skim) milk

## 2022-01-12 NOTE — Hospital Course (Addendum)
#  Acute hypoxic and hypercarbia respiratory failure secondary to COVID-19 pneumonia #Acute metabolic encephalopathy secondary to hypercarbia, resolved Patient initially with acute metabolic encephalopathy secondary to hypercarbia and required BiPAP support and was transferred to the ICU for a day. ABG at that time was 7.28/78/80. Patient was able to be weaned off of BiPAP and transitioned to nasal cannula, with significant improvement in her mental status. Patient was initially started on decadron and remdesivir in the setting of her COVID infection, however, was transitioned to solumedrol and tocilizumab. Received 1 dose of tocilizumab and received 3 days of solumedrol 80 mg. Tapered steroids with prednisone 40 mg x 3 days, followed by prednisone 20 mg x3 days, and then prednisone 10 mg x 3 days. Will be set up with supplemental home O2 and recommend referral to outpatient pulmonologist for formal PFT testing.   #Tobacco use disorder Counseled on smoking cessation. Consider Chantix in the future, however, patient states she will quit smoking cigarettes. Discussed importance of not smoking, especially while on supplemental O2.    #Hyperglycemia A1c 6.6, borderline diabetic.  CBGs well controlled.  #Type II NSTEMI Patient noted to have elevated troponins 227>349. Patient does not endorse any chest pain. EKG with diffuse ST segment depression and conduction delay without prior tracings noted. Suspect patient's elevated troponins are in setting of demand ischemia in setting of hypoxia. Echo this admission with EF of 60-65% and no wall motion abnormalities noted. There is moderate LVH and grade II diastolic dysfunction with mild aortic stenosis noted.

## 2022-01-12 NOTE — Progress Notes (Signed)
SATURATION QUALIFICATIONS: (This note is used to comply with regulatory documentation for home oxygen)  Patient Saturations on Room Air at Rest = 87%  Patient Saturations on Room Air while Ambulating = 85%  Patient Saturations on 2 Liters of oxygen while Ambulating = 93%  Please briefly explain why patient needs home oxygen:Needs supplemental O2 at rest and with activity to maintain adequate oxygenation.  Georga Hacking [facility] PT Acute Rehabilitation Services Pager [phone] Office [phone]

## 2022-01-12 NOTE — Plan of Care (Signed)
Nutrition Education Note  RD consulted for nutrition education regarding a Heart Healthy diet. Pt with PMH of HTN. Per MD request, spoke with pt's daughter Marcelino Duster via phone call.  RD has attached "Hypertension Nutrition Therapy" handout from the Academy of [employer] to pt's AVS/Discharge Instructions. Marcelino Duster is aware of this and will look for the handout at discharge.  Reviewed patient's dietary recall with daughter. Pt has previously been a truck driver and has therefore relied heavily on fast food for meals due to not having adequate access to fresh foods or a place to cook/prepare meals. 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  Provided examples on ways to decrease sodium and fat intake in diet. Discouraged intake of processed foods and use of salt shaker. Encouraged fresh fruits and vegetables as well as whole grain sources of carbohydrates to maximize fiber intake. Teach back method used.  Expect good compliance.  Current diet order is Heart Healthy, patient is consuming approximately 50-100% of meals at this time. Labs and medications reviewed. No further nutrition interventions warranted at this time. If additional nutrition issues arise, please re-consult RD.   Mertie Clause, MS, RD, LDN Inpatient Clinical Dietitian Please see AMiON for contact information.

## 2022-01-12 NOTE — TOC Transition Note (Signed)
Transition of Care [facility]) - CM/SW Discharge Note   Patient Details  Name: Natalie Alexander MRN: [id number] Date of Birth: May 31, 1956  [facility]) CM/SW Contact:  Tom-Johnson, Hershal Coria, RN Phone Number: 01/12/2022, 3:02 PM   Clinical Narrative:    Patient is scheduled for discharge today. From home with daughter and she will transport. Oxygen ordered from [employer] and [facility] to deliver at bedside. No further TOC needs noted.   Final next level of care: Home/Self Care Barriers to Discharge: Barriers Resolved   Patient Goals and CMS Choice Patient states their goals for this hospitalization and ongoing recovery are:: To return home CMS Medicare.gov Compare Post Acute Care list provided to:: Patient Choice offered to / list presented to : Patient, Adult Children Lucila [place], daughter Michelle's son.)  Discharge Placement                       Discharge Plan and Services                DME Arranged: 3-N-1, Oxygen DME Agency: [employer] Date DME Agency Contacted: 01/12/22 Time DME Agency Contacted: 1330 Representative spoke with at DME Agency: Ian Malkin HH Arranged: NA HH Agency: NA        Social Determinants of Health (SDOH) Interventions     Readmission Risk Interventions No flowsheet data found.

## 2022-01-12 NOTE — Discharge Summary (Signed)
Name: Natalie Alexander MRN: [id number] DOB: 1956-01-22 66 y.o. PCP: Elisabeth Cara, PA-C  Date of Admission: 01/08/2022  2:23 AM Date of Discharge:  01/12/2022 Attending Physician: Dr. Evette Doffing  DISCHARGE DIAGNOSIS:  Primary Problem: Pneumonia due to COVID-19 virus   Hospital Problems: Principal Problem:   Pneumonia due to COVID-19 virus Active Problems:   Essential hypertension   Acute respiratory failure with hypoxia and hypercarbia (HCC)   Acute hypoxic and hypercarbic respiratory failure secondary to COVID pneumonia Acute metabolic encephalopathy secondary to hypercarbia, resolved Tobacco use disorder Diastolic dysfunction Hyperglycemia Hypertension  DISCHARGE MEDICATIONS:   Allergies as of 01/12/2022   No Known Allergies      Medication List     TAKE these medications    aspirin EC 81 MG tablet Take 81 mg by mouth daily. Swallow whole.   Dextromethorphan-guaiFENesin 10-100 MG/5ML liquid Take 10 mLs by mouth every 4 (four) hours as needed.   predniSONE 20 MG tablet Commonly known as: DELTASONE Take 2 tablets (40 mg total) by mouth daily with breakfast for 2 days, THEN 1 tablet (20 mg total) daily with breakfast for 3 days, THEN 1/2 tablet (10 mg total) daily with breakfast for 3 days. Start taking on: January 13, 2022               Durable Medical Equipment  (From admission, onward)           Start     Ordered   01/12/22 1237  DME Oxygen  Once       Question Answer Comment  Length of Need 6 Months   Mode or (Route) Nasal cannula   Liters per Minute 3   Frequency Continuous (stationary and portable oxygen unit needed)   Oxygen conserving device Yes   Oxygen delivery system Gas      01/12/22 1237            DISPOSITION AND FOLLOW-UP:  NatalieNatalie Alexander was discharged from [facility] in Stable condition. At the hospital follow up visit please address:  COVID pneumonia: needs referral to outpatient  pulmonologist for PFTs. Prednisone taper prescribed. Reassess symptoms and how she is doing with home O2.  Tobacco use: Ensure patient has quit smoking  Follow-up Recommendations: Consults: Pulmonary medicine Labs:  none Studies: outpatient PFTs Medications: none, can use prn robitussin  Follow-up Appointments:  Follow-up Information     [place], [place], PA-C Follow up in 2 week(s).   Specialty: Family Medicine Why: hospital follow up Contact information: [id number] [place] [address] [phone]                 HOSPITAL COURSE:  Patient Summary: #Acute hypoxic and hypercarbia respiratory failure secondary to COVID-19 pneumonia #Acute metabolic encephalopathy secondary to hypercarbia, resolved Patient initially with acute metabolic encephalopathy secondary to hypercarbia and required BiPAP support and was transferred to the ICU for a day. ABG at that time was 7.28/78/80. Patient was able to be weaned off of BiPAP and transitioned to nasal cannula, with significant improvement in her mental status. Patient was initially started on decadron and remdesivir in the setting of her COVID infection, however, was transitioned to solumedrol and tocilizumab. Received 1 dose of tocilizumab and received 3 days of solumedrol 80 mg. Tapered steroids with prednisone 40 mg x 3 days, followed by prednisone 20 mg x3 days, and then prednisone 10 mg x 3 days. Will be set up with supplemental home O2 and recommend referral  to outpatient pulmonologist for formal PFT testing.   #Tobacco use disorder Counseled on smoking cessation. Consider Chantix in the future, however, patient states she will quit smoking cigarettes. Discussed importance of not smoking, especially while on supplemental O2.    #Hyperglycemia A1c 6.6, borderline diabetic.  CBGs well controlled.  #Type II NSTEMI Patient noted to have elevated troponins 227>349. Patient does not endorse any chest pain.  EKG with diffuse ST segment depression and conduction delay without prior tracings noted. Suspect patient's elevated troponins are in setting of demand ischemia in setting of hypoxia. Echo this admission with EF of 60-65% and no wall motion abnormalities noted. There is moderate LVH and grade II diastolic dysfunction with mild aortic stenosis noted.    DISCHARGE INSTRUCTIONS:   Discharge Instructions     Call MD for:  difficulty breathing, headache or visual disturbances   Complete by: As directed    Call MD for:  temperature >100.4   Complete by: As directed    Diet general   Complete by: As directed    Increase activity slowly   Complete by: As directed       Dear Natalie Alexander, You were hospitalized for COVID-pneumonia.  You were treated with steroids and an antiviral medication which helped improve your symptoms.  I am glad you are feeling much better!  You will need to take steroids for a few more days to complete this treatment- please take them as followed:   Prednisone 40 mg for 2 days Prednisone 20 mg for 3 days Prednisone 10 mg for 3 days  Please make an appointment to follow-up with your primary care physician in 2 weeks for hospital follow-up appointment.  You will also need a referral to a pulmonologist (lung doctor) as we suspect you may have COPD.  It is very important to quit smoking, especially while you are using your home supplemental oxygen.  SUBJECTIVE:  Natalie Alexander was seen and evaluated on the day of discharge. She feels much better today and her spirits are up, as her family is visiting. Her breathing is improved and she was able to ambulate with PT/OT.   Discharge Vitals:   BP (!) 153/88    Pulse (!) 57    Temp 98.5 F (36.9 C) (Oral)    Resp 20    Wt 72.8 kg    SpO2 94%   OBJECTIVE:  General: Resting comfortably in bedside chair. No acute distress. CV: RRR. No murmurs, rubs, or gallops.  Pulmonary: Lungs CTAB. Normal effort on 2 L  Abdominal:  Soft, nontender, nondistended.  Skin: Warm and dry.  Neuro: A&Ox3. Moves all extremities. No focal deficit. Psych: Normal mood and affect    Pertinent Labs, Studies, and Procedures:  CBC Latest Ref Rng & Units 01/12/2022 01/11/2022 01/10/2022  WBC 4.0 - 10.5 K/uL 13.9(H) 17.7(H) 11.0(H)  Hemoglobin 12.0 - 15.0 g/dL 13.5 12.3 12.0  Hematocrit 36.0 - 46.0 % 41.8 38.8 38.5  Platelets 150 - 400 K/uL 327 308 264    CMP Latest Ref Rng & Units 01/11/2022 01/10/2022 01/10/2022  Glucose 70 - 99 mg/dL 120(H) 121(H) 155(H)  BUN 8 - 23 mg/dL 21 23 24(H)  Creatinine 0.44 - 1.00 mg/dL 0.69 0.63 0.58  Sodium 135 - 145 mmol/L 143 142 144  Potassium 3.5 - 5.1 mmol/L 4.0 3.9 4.1  Chloride 98 - 111 mmol/L 98 97(L) 99  CO2 22 - 32 mmol/L 31 33(H) 35(H)  Calcium 8.9 - 10.3 mg/dL 8.8(L) 8.7(L) 8.8(L)  Total Protein 6.5 - 8.1 g/dL - - 6.6  Total Bilirubin 0.3 - 1.2 mg/dL - - 0.6  Alkaline Phos 38 - 126 U/L - - 128(H)  AST 15 - 41 U/L - - 19  ALT 0 - 44 U/L - - 22       Signed: Suellen Durocher, D.O.  Internal Medicine Resident, PGY-1 [facility]  Pager: [phone] 12:46 PM, 01/12/2022

## 2022-01-12 NOTE — Progress Notes (Signed)
Occupational Therapy Treatment Patient Details Name: Natalie Alexander MRN: [id number] DOB: 10/09/1956 Today's Date: 01/12/2022   History of present illness Pt is a 66 y/o female admitted for PNA due to COVID-19 with new need for supplemental O2. Pt also noted with acute metabolic encephalopathy. PMH: COPD   OT comments  Pt progressing well towards OT goals, eager for OOB activities. Session focused on UE HEP education to maximize strength with good carryover, as well as energy conservation education (handout provided) with translation to daily tasks. Pt able to perform LB ADLs with setup assist and line mgmt assist. Pt on track to DC home without OT follow-up but will benefit from further education in O2 line mgmt and monitoring of sats.  Desats to 88% on 2 L O2 with exercise, returns to 91% within 1 min seated rest break   Recommendations for follow up therapy are one component of a multi-disciplinary discharge planning process, led by the attending physician.  Recommendations may be updated based on patient status, additional functional criteria and insurance authorization.    Follow Up Recommendations  No OT follow up    Assistance Recommended at Discharge Set up Supervision/Assistance  Patient can return home with the following  A little help with bathing/dressing/bathroom;Assistance with cooking/housework;Help with stairs or ramp for entrance   Equipment Recommendations  None recommended by OT    Recommendations for Other Services      Precautions / Restrictions Precautions Precautions: Fall;Other (comment) Precaution Comments: monitor O2 (does not wear at baseline) Restrictions Weight Bearing Restrictions: No       Mobility Bed Mobility Overal bed mobility: Modified Independent Bed Mobility: Supine to Sit     Supine to sit: Modified independent (Device/Increase time)          Transfers Overall transfer level: Independent Equipment used: None Transfers: Sit  to/from Stand;Bed to chair/wheelchair/BSC Sit to Stand: Independent   Step pivot transfers: Modified independent (Device/Increase time)             Balance Overall balance assessment: Needs assistance Sitting-balance support: Feet supported;No upper extremity supported Sitting balance-Leahy Scale: Good     Standing balance support: No upper extremity supported;During functional activity Standing balance-Leahy Scale: Fair                             ADL either performed or assessed with clinical judgement   ADL Overall ADL's : Needs assistance/impaired             Lower Body Bathing: Set up;Sit to/from stand Lower Body Bathing Details (indicate cue type and reason): to bathe peri region in standing     Lower Body Dressing: Set up;Sit to/from stand Lower Body Dressing Details (indicate cue type and reason): to doff/don mesh undies               General ADL Comments: Session focused on UE HEP education with good carryover, energy conservation with handout and incentive spirometer use    Extremity/Trunk Assessment Upper Extremity Assessment Upper Extremity Assessment: Overall WFL for tasks assessed   Lower Extremity Assessment Lower Extremity Assessment: Defer to PT evaluation        Vision   Vision Assessment?: No apparent visual deficits   Perception     Praxis      Cognition Arousal/Alertness: Awake/alert Behavior During Therapy: WFL for tasks assessed/performed;Impulsive Overall Cognitive Status: Impaired/Different from baseline Area of Impairment: Safety/judgement;Awareness  Safety/Judgement: Decreased awareness of deficits Awareness: Emergent   General Comments: more appropriate conversation. pleasant and witty though impulsive with movements at times. answers orientation questions appropriately and follows directions consistently          Exercises     Shoulder Instructions       General  Comments brief desat to 88% on 2 L O2 during exercises, increased to 90% with rest break    Pertinent Vitals/ Pain       Pain Assessment: No/denies pain  Home Living                                          Prior Functioning/Environment              Frequency  Min 2X/week        Progress Toward Goals  OT Goals(current goals can now be found in the care plan section)  Progress towards OT goals: Progressing toward goals  Acute Rehab OT Goals Patient Stated Goal: go home soon OT Goal Formulation: With patient Time For Goal Achievement: 01/24/22 Potential to Achieve Goals: Good ADL Goals Pt Will Perform Lower Body Bathing: with modified independence;sit to/from stand Pt/caregiver will Perform Home Exercise Program: Increased strength;Both right and left upper extremity;With theraband;Independently;With written HEP provided Additional ADL Goal #1: Pt to verbalize at least 3 energy conservation strategies to implement during ADLs, IADLs, and mobility Additional ADL Goal #2: Pt to demonstrate implementation of pursed lip breathing independently in order to maintain sats >90%  Plan Discharge plan remains appropriate    Co-evaluation                 AM-PAC OT "6 Clicks" Daily Activity     Outcome Measure   Help from another person eating meals?: None Help from another person taking care of personal grooming?: None Help from another person toileting, which includes using toliet, bedpan, or urinal?: A Little Help from another person bathing (including washing, rinsing, drying)?: A Little Help from another person to put on and taking off regular upper body clothing?: A Little Help from another person to put on and taking off regular lower body clothing?: A Little 6 Click Score: 20    End of Session Equipment Utilized During Treatment: Oxygen  OT Visit Diagnosis: Other abnormalities of gait and mobility (R26.89);Muscle weakness (generalized)  (M62.81)   Activity Tolerance Patient tolerated treatment well   Patient Left in chair;with call bell/phone within reach   Nurse Communication Mobility status;Other (comment) (purewick malfunction, appropriate for independent BSC transfers)        Time: 7846-9629 OT Time Calculation (min): 31 min  Charges: OT General Charges $OT Visit: 1 Visit OT Treatments $Self Care/Home Management : 8-22 mins $Therapeutic Exercise: 8-22 mins  Bradd Canary, OTR/L Acute Rehab Services Office: [phone]   Lorre Munroe 01/12/2022, 7:51 AM

## 2022-01-12 NOTE — Plan of Care (Signed)
°  Problem: Clinical Measurements: Goal: Will remain free from infection Outcome: Adequate for Discharge Goal: Diagnostic test results will improve Outcome: Adequate for Discharge Goal: Respiratory complications will improve Outcome: Adequate for Discharge Goal: Cardiovascular complication will be avoided Outcome: Adequate for Discharge   Problem: Elimination: Goal: Will not experience complications related to bowel motility Outcome: Adequate for Discharge Goal: Will not experience complications related to urinary retention Outcome: Adequate for Discharge   Problem: Pain Managment: Goal: General experience of comfort will improve Outcome: Adequate for Discharge   Problem: Safety: Goal: Ability to remain free from injury will improve Outcome: Adequate for Discharge   Problem: Skin Integrity: Goal: Risk for impaired skin integrity will decrease Outcome: Adequate for Discharge   Problem: Acute Rehab OT Goals (only OT should resolve) Goal: Pt. Will Perform Lower Body Bathing Outcome: Adequate for Discharge Goal: Pt/Caregiver Will Perform Home Exercise Program Outcome: Adequate for Discharge Goal: OT Additional ADL Goal #1 Outcome: Adequate for Discharge Goal: OT Additional ADL Goal #2 Outcome: Adequate for Discharge   Problem: Food- and Nutrition-Related Knowledge Deficit (NB-1.1) Goal: Nutrition education Description: Formal process to instruct or train a patient/client in a skill or to impart knowledge to help patients/clients voluntarily manage or modify food choices and eating behavior to maintain or improve health. Outcome: Adequate for Discharge

## 2022-01-12 NOTE — Progress Notes (Signed)
Physical Therapy Treatment Patient Details Name: Natalie Alexander MRN: [id number] DOB: [birth year] Today's Date: 01/12/2022   History of Present Illness Pt is a 66 y/o female admitted for PNA due to COVID-19 with new need for supplemental O2. Pt also noted with acute metabolic encephalopathy. PMH: COPD    PT Comments    Pt doing well with mobility and no further PT needed.  Ready for dc from PT standpoint. Requiring supplemental O2 at rest and with activity.     Recommendations for follow up therapy are one component of a multi-disciplinary discharge planning process, led by the attending physician.  Recommendations may be updated based on patient status, additional functional criteria and insurance authorization.  Follow Up Recommendations  No PT follow up     Assistance Recommended at Discharge PRN  Patient can return home with the following Other (comment) (No assist with mobility)   Equipment Recommendations  None recommended by PT    Recommendations for Other Services       Precautions / Restrictions Precautions Precautions: Fall;Other (comment) Precaution Comments: monitor O2 (does not wear at baseline)     Mobility  Bed Mobility               General bed mobility comments: up in chair    Transfers Overall transfer level: Independent Equipment used: None Transfers: Sit to/from Stand;Bed to chair/wheelchair/BSC Sit to Stand: Independent                Ambulation/Gait Ambulation/Gait assistance: Modified independent (Device/Increase time) Gait Distance (Feet): 100 Feet Assistive device: None Gait Pattern/deviations: WFL(Within Functional Limits) Gait velocity: decr Gait velocity interpretation: 1.31 - 2.62 ft/sec, indicative of limited community ambulator (limited due to small confines of room)   General Gait Details: Steady gait without assitive device   Stairs             Wheelchair Mobility    Modified Rankin (Stroke Patients  Only)       Balance Overall balance assessment: Independent                                          Cognition Arousal/Alertness: Awake/alert Behavior During Therapy: WFL for tasks assessed/performed;Impulsive Overall Cognitive Status: Within Functional Limits for tasks assessed                                          Exercises      General Comments General comments (skin integrity, edema, etc.): SpO2 97% on 2L at rest. SpO2 87% on RA at rest. Amb on RA 85% and 93% amb on 2L      Pertinent Vitals/Pain      Home Living                          Prior Function            PT Goals (current goals can now be found in the care plan section) Progress towards PT goals: Goals met/education completed, patient discharged from PT    Frequency           PT Plan Other (comment) (DC from PT)    Co-evaluation              AM-PAC PT "6 Clicks" Mobility  Outcome Measure  Help needed turning from your back to your side while in a flat bed without using bedrails?: None Help needed moving from lying on your back to sitting on the side of a flat bed without using bedrails?: None Help needed moving to and from a bed to a chair (including a wheelchair)?: None Help needed standing up from a chair using your arms (e.g., wheelchair or bedside chair)?: None Help needed to walk in hospital room?: None Help needed climbing 3-5 steps with a railing? : None 6 Click Score: 24    End of Session Equipment Utilized During Treatment: Oxygen Activity Tolerance: Patient tolerated treatment well Patient left: in chair;with call bell/phone within reach;with family/visitor present Nurse Communication: Mobility status PT Visit Diagnosis: Other abnormalities of gait and mobility (R26.89)     Time: 1749-4496 PT Time Calculation (min) (ACUTE ONLY): 19 min  Charges:  $Gait Training: 8-22 mins                    [place] Pager [phone] Office [place] 01/12/2022, 12:38 PM

## 2022-01-13 LAB — CULTURE, BLOOD (ROUTINE X 2)
Culture: NO GROWTH
Culture: NO GROWTH
Special Requests: ADEQUATE

## 2022-03-01 ENCOUNTER — Other Ambulatory Visit: Payer: Self-pay

## 2022-03-01 ENCOUNTER — Encounter: Payer: Self-pay | Admitting: Pulmonary Disease

## 2022-03-01 ENCOUNTER — Ambulatory Visit (INDEPENDENT_AMBULATORY_CARE_PROVIDER_SITE_OTHER): Payer: PRIVATE HEALTH INSURANCE | Admitting: Pulmonary Disease

## 2022-03-01 VITALS — BP 144/86 | HR 61 | Ht 69.0 in | Wt 164.0 lb

## 2022-03-01 DIAGNOSIS — U071 COVID-19: Secondary | ICD-10-CM

## 2022-03-01 DIAGNOSIS — J9601 Acute respiratory failure with hypoxia: Secondary | ICD-10-CM

## 2022-03-01 DIAGNOSIS — Z87891 Personal history of nicotine dependence: Secondary | ICD-10-CM

## 2022-03-01 DIAGNOSIS — J9602 Acute respiratory failure with hypercapnia: Secondary | ICD-10-CM

## 2022-03-01 DIAGNOSIS — J1282 Pneumonia due to coronavirus disease 2019: Secondary | ICD-10-CM | POA: Diagnosis not present

## 2022-03-01 MED ORDER — STIOLTO RESPIMAT 2.5-2.5 MCG/ACT IN AERS: 2.0000 | INHALATION_SPRAY | Freq: Every day | RESPIRATORY_TRACT | 0 refills | Status: DC

## 2022-03-01 NOTE — Patient Instructions (Addendum)
Try stiolto inhaler 2 puffs daily over the next month ?- let us know if you notice improvement in your breathing and we will send in prescription ? ?We will check an overnight oximitry ? ?We will walk you today and monitor your oxygen levels ? ?Follow up in 6 months with pulmonary function tests. ? ? ?

## 2022-03-01 NOTE — Progress Notes (Signed)
Synopsis: Referred in March 2023 for COPD by Vedia Coffer, [place]  Subjective:   PATIENT ID: Natalie Alexander GENDER: female DOB: 31-Oct-1956, MRN: [id number]  HPI  Chief Complaint  Patient presents with   Consult    Referred by PCP for possible COPD. Was in the hospital a few weeks ago for PNA. Has been using 2.5L-3L of O2 as needed.     Natalie Alexander is a 66 year old woman, recent former smoker who is referred to pulmonary clinic after recent hospitalization for Covid 19 pneumonia and acute hypoxemic respiratory failure.   Patient was admitted to [facility] 1/10 to 1/13 for acute hypoxemic respiratory failure in the setting of COVID-19 pneumonia and possible concurrent bacterial pneumonia.  She was discharged on 2 L supplemental oxygen.  She is not currently using any inhalers.  She continues to use incentive spirometer at home.  She currently has shortness of breath when showering and getting dressed.  She denies any cough or wheezing.  She has minimal phlegm production in the morning time.  She has not smoked since her hospital admission.  She was previously smoking 2.5 to 3 packs/day and has been smoking for 50 years.  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  Her daughter has accompanied her for today's visit.  Past Medical History:  Diagnosis Date   COPD (chronic obstructive pulmonary disease) ([place])      No family history on file.   Social History   Socioeconomic History   Marital status: Legally Separated    Spouse name: Not on file   Number of children: Not on file   Years of education: Not on file   Highest education level: Not on file  Occupational History   Not on file  Tobacco Use   Smoking status: Every Day    Types: Cigarettes   Smokeless tobacco: Never  Substance and Sexual Activity   Alcohol use: Not on file   Drug use: Not on file   Sexual activity: Not on file  Other Topics Concern   Not on file  Social  History Narrative   Not on file   Social Determinants of Health   Financial Resource Strain: Not on file  Food Insecurity: Not on file  Transportation Needs: Not on file  Physical Activity: Not on file  Stress: Not on file  Social Connections: Not on file  Intimate Partner Violence: Not on file     No Known Allergies   Outpatient Medications Prior to Visit  Medication Sig Dispense Refill   aspirin EC 81 MG tablet Take 81 mg by mouth daily. Swallow whole.     Dextromethorphan-guaiFENesin 10-100 MG/5ML liquid Take 10 mLs by mouth every 4 (four) hours as needed. 118 mL 0   metoprolol succinate (TOPROL-XL) 25 MG 24 hr tablet Take 1 tablet by mouth daily.     No facility-administered medications prior to visit.    Review of Systems  Constitutional:  Negative for chills, fever, malaise/fatigue and weight loss.  HENT:  Negative for congestion, sinus pain and sore throat.   Eyes: Negative.   Respiratory:  Positive for shortness of breath. Negative for cough, hemoptysis, sputum production and wheezing.   Cardiovascular:  Negative for chest pain, palpitations, orthopnea, claudication and leg swelling.  Gastrointestinal:  Negative for abdominal pain, heartburn, nausea and vomiting.  Genitourinary: Negative.   Musculoskeletal:  Negative for joint pain and myalgias.  Skin:  Negative for rash.  Neurological:  Negative for weakness.  Endo/Heme/Allergies: Negative.   Psychiatric/Behavioral: Negative.       Objective:   Vitals:   03/01/22 1456  BP: (!) 144/86  Pulse: 61  SpO2: 99%  Weight: 164 lb (74.4 kg)  Height: 5\' 9"  (1.753 m)   Physical Exam Constitutional:      General: She is not in acute distress.    Appearance: She is not ill-appearing.  HENT:     Head: Normocephalic and atraumatic.  Eyes:     General: No scleral icterus.    Conjunctiva/sclera: Conjunctivae normal.     Pupils: Pupils are equal, round, and reactive to light.  Cardiovascular:     Rate and Rhythm:  Normal rate and regular rhythm.     Pulses: Normal pulses.     Heart sounds: Normal heart sounds. No murmur heard. Pulmonary:     Effort: Pulmonary effort is normal.     Breath sounds: Decreased breath sounds present. No wheezing, rhonchi or rales.  Abdominal:     General: Bowel sounds are normal.     Palpations: Abdomen is soft.  Musculoskeletal:     Right lower leg: No edema.     Left lower leg: No edema.  Lymphadenopathy:     Cervical: No cervical adenopathy.  Skin:    General: Skin is warm and dry.  Neurological:     General: No focal deficit present.     Mental Status: She is alert.  Psychiatric:        Mood and Affect: Mood normal.        Behavior: Behavior normal.        Thought Content: Thought content normal.        Judgment: Judgment normal.   CBC    Component Value Date/Time   WBC 13.9 (H) 01/12/2022 0218   RBC 4.54 01/12/2022 0218   HGB 13.5 01/12/2022 0218   HCT 41.8 01/12/2022 0218   PLT 327 01/12/2022 0218   MCV 92.1 01/12/2022 0218   MCH 29.7 01/12/2022 0218   MCHC 32.3 01/12/2022 0218   RDW 14.7 01/12/2022 0218   LYMPHSABS 1.0 01/08/2022 0224   MONOABS 1.5 (H) 01/08/2022 0224   EOSABS 0.0 01/08/2022 0224   BASOSABS 0.0 01/08/2022 0224   BMP Latest Ref Rng & Units 01/11/2022 01/10/2022 01/10/2022  Glucose 70 - 99 mg/dL 120(H) 121(H) 155(H)  BUN 8 - 23 mg/dL 21 23 24(H)  Creatinine 0.44 - 1.00 mg/dL 0.69 0.63 0.58  Sodium 135 - 145 mmol/L 143 142 144  Potassium 3.5 - 5.1 mmol/L 4.0 3.9 4.1  Chloride 98 - 111 mmol/L 98 97(L) 99  CO2 22 - 32 mmol/L 31 33(H) 35(H)  Calcium 8.9 - 10.3 mg/dL 8.8(L) 8.7(L) 8.8(L)   Chest imaging: CTA Chest 01/08/22 1. No evidence of pulmonary embolism. 2. Severe bronchitis with severe multilobar bilateral bronchopneumonia, as above. 3. Cardiomegaly with concentric left ventricular hypertrophy. 4. There are calcifications of the aortic valve. Echocardiographic correlation for evaluation of potential valvular dysfunction  may be warranted if clinically indicated. 5. Aortic atherosclerosis, in addition to 2 vessel coronary artery disease. Please note that although the presence of coronary artery calcium documents the presence of coronary artery disease, the severity of this disease and any potential stenosis cannot be assessed on this non-gated CT examination. Assessment for potential risk factor modification, dietary therapy or pharmacologic therapy may be warranted, if clinically indicated.  PFT: No flowsheet data found.  Labs:  Path:  Echo 01/08/22: EF 60-65%. Grade  II diastolic dysfunction. RV is normal. RVSP 9mmHg.  Heart Catheterization:  Assessment & Plan:   Former smoker  Pneumonia due to COVID-19 virus  Acute respiratory failure with hypoxia and hypercarbia ([place]) - Plan: Pulse oximetry, overnight, Pulmonary Function Test  Discussion: Natalie Alexander is a 66 year old woman, recent former smoker who is referred to pulmonary clinic after recent hospitalization for Covid 19 pneumonia and acute hypoxemic respiratory failure.   Patient is feeling significantly better since hospital admission.  She does experience exertional dyspnea but on simple walk in clinic today she did not desaturate below 88% and does not require supplemental oxygen at rest or with exertion.  We will check overnight oximetry to determine if she needs supplemental oxygen at night when sleeping.  She is to try Stiolto inhaler 2 puffs twice daily for concern of underlying obstructive lung disease.  She is to let us know if she does notice benefit in her breathing and we will then call in a prescription for her to continue on this inhaler.  She is to follow-up in 6 months with pulmonary function test.  Freda Jackson, MD [facility] Office: [phone]   Current Outpatient Medications:    aspirin EC 81 MG tablet, Take 81 mg by mouth daily. Swallow whole., Disp: , Rfl:     Dextromethorphan-guaiFENesin 10-100 MG/5ML liquid, Take 10 mLs by mouth every 4 (four) hours as needed., Disp: 118 mL, Rfl: 0   metoprolol succinate (TOPROL-XL) 25 MG 24 hr tablet, Take 1 tablet by mouth daily., Disp: , Rfl:    Tiotropium Bromide-Olodaterol (STIOLTO RESPIMAT) 2.5-2.5 MCG/ACT AERS, Inhale 2 puffs into the lungs daily., Disp: 8 g, Rfl: 0

## 2022-03-01 NOTE — Progress Notes (Signed)
Patient seen in the office today and instructed on use of Stiolto.  Patient expressed understanding and demonstrated technique. ? ?Natalie Alexander CMA 03/01/2022 ?

## 2022-03-03 ENCOUNTER — Encounter: Payer: Self-pay | Admitting: Pulmonary Disease

## 2022-03-14 ENCOUNTER — Telehealth: Payer: Self-pay | Admitting: Pulmonary Disease

## 2022-03-14 DIAGNOSIS — J9611 Chronic respiratory failure with hypoxia: Secondary | ICD-10-CM

## 2022-03-14 NOTE — Telephone Encounter (Signed)
Please let patient know that she qualifies for oxygen at night when sleeping based on her ONO results with 2hrs 6 mins below 88%. I recommend she use 2L of O2 with humidification at night. Please send orders to DME company. ? ?Thanks, ?JD ?

## 2022-03-14 NOTE — Telephone Encounter (Signed)
Received email with Melissa with [employer] with this pt's ONO on RA dated 03/08/22. Total sleep time 9 hours 24 min and total time spent less than 88% ra was 2 hours, 6 min and 58 seconds. Sending to Dr Francine Graven to review and paper copy printed to be signed and scanned. Please advise on ONO, thanks! ?

## 2022-03-14 NOTE — Telephone Encounter (Signed)
Called and spoke with pt letting her know the results of the ONO and she verbalized understanding. Pt stated that she already has O2 at home so made sure she was aware to use 2L at night. ? ?Nothing further needed. ?

## 2022-03-21 ENCOUNTER — Telehealth: Payer: Self-pay | Admitting: Pulmonary Disease

## 2022-03-21 NOTE — Telephone Encounter (Signed)
Called and spoke with patient. She is in the process of trying to return back to work. She was hopeful that she would be able to return by the end of this month but her BP has been running above 140/90. 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  ? ?She wanted to know from a pulmonary standpoint should she be able to return back to work. She confirmed that she is using the 2L of O2 at night and has been doing well since her consult. She would like the clearance to be documented in a letter.  ? ?JD, please advise. Thanks!  ?

## 2022-03-22 NOTE — Telephone Encounter (Signed)
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  ? ?Thanks, ?JD ?

## 2022-03-23 NOTE — Telephone Encounter (Signed)
Patient dropped off Medical Certification forms from [facility] for Dr. Erin Fulling to fill out. I have sent to [facility] to prepare. ?

## 2022-03-26 NOTE — Telephone Encounter (Signed)
Patrice filled in forms and indicated patient is able to return to work.  Dr. Francine Graven will need to review and let us know if he disagrees.  Sent form to [facility] for signature. ?

## 2022-03-28 NOTE — Telephone Encounter (Signed)
Forms signed, faxed to [facility]- patient informed and will be coming by to pick up a copy. Nothing further needed. ?

## 2022-04-02 ENCOUNTER — Telehealth: Payer: Self-pay | Admitting: Pulmonary Disease

## 2022-04-02 MED ORDER — STIOLTO RESPIMAT 2.5-2.5 MCG/ACT IN AERS: 2.0000 | INHALATION_SPRAY | Freq: Every day | RESPIRATORY_TRACT | 6 refills | Status: DC

## 2022-04-02 NOTE — Telephone Encounter (Signed)
Called and spoke with patient. She was calling to see if we had any more samples of Stiolto. She was not able to tell a difference with the samples. I advised her that we do not have any samples but I could send a RX to her pharmacy. She verbalized understanding.  ? ?RX has been sent.  ? ?Nothing further needed at time of call.  ?

## 2022-04-02 NOTE — Telephone Encounter (Signed)
Stiolto pended,  attempted to contact patient unable to leave voice mail due to mailbox being full.  ?

## 2023-04-01 IMAGING — CT CT HEAD W/O CM
4 series · 16 of 47 positions shown, 18 images · non-contrast
Comparison: No priors.

CLINICAL DATA: 65-year-old female with history of shortness of
breath. Respiratory distress. Altered mental status.

EXAM:
CT HEAD WITHOUT CONTRAST
TECHNIQUE: Contiguous axial images were obtained from the base of the skull
through the vertex without intravenous contrast.

[Series 3: head without · axial · non-contrast · 0.44mm/px · z∈[-23,+97]mm · 7 of 33 slices shown, 9 images]
[im 5/33  brain]
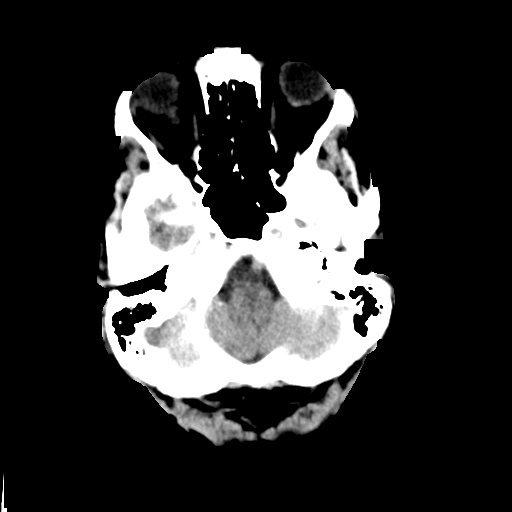
[im 5/33  bone]
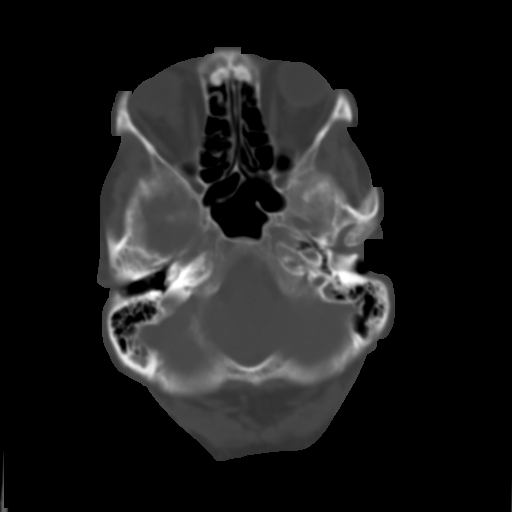
[im 9/33  brain]
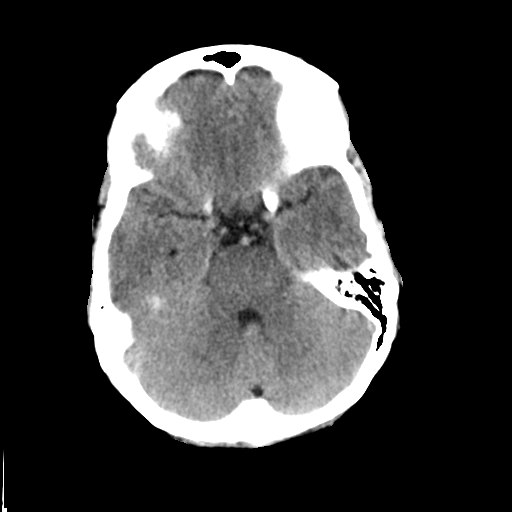
[im 13/33  brain]
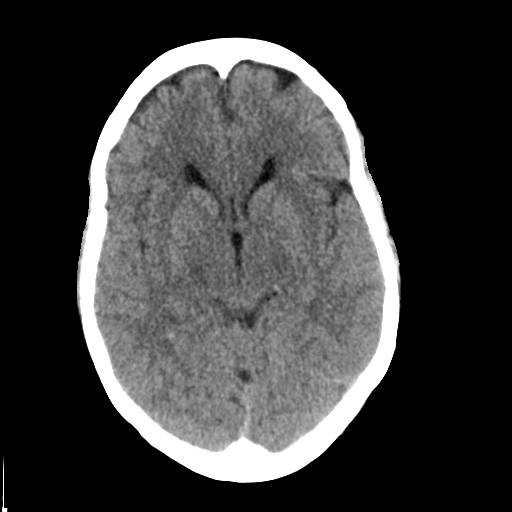
[im 17/33  brain]
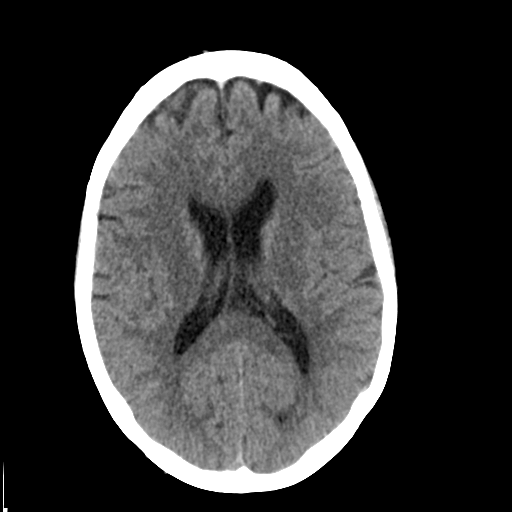
[im 21/33  brain]
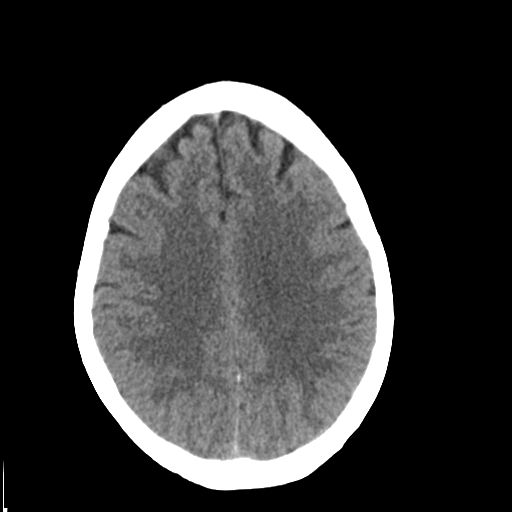
[im 21/33  bone]
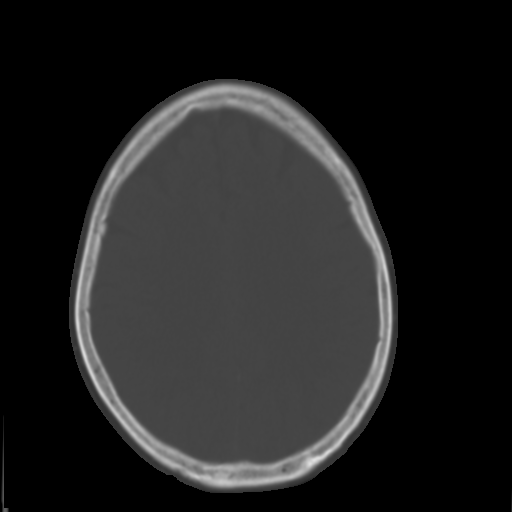
[im 25/33  brain]
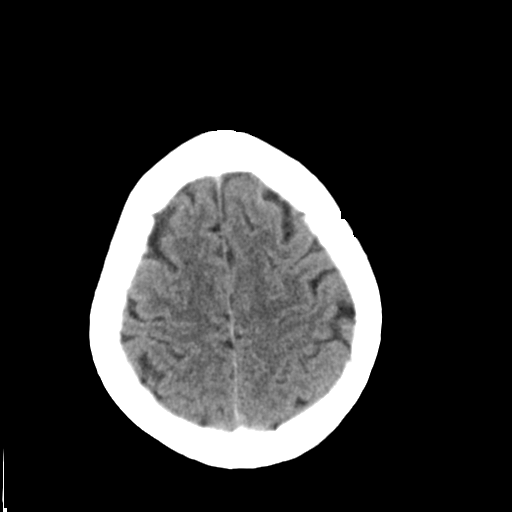
[im 29/33  brain]
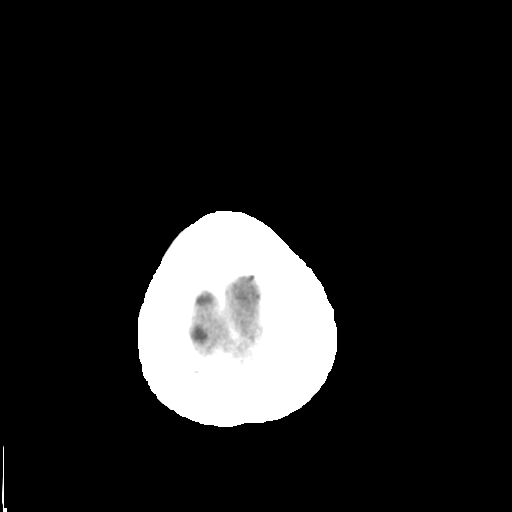

[Series 4: ax head bone · axial · 0.33mm/px · z∈[-13,+17]mm · 3 of 82 slices shown]
[im 9/82  bone]
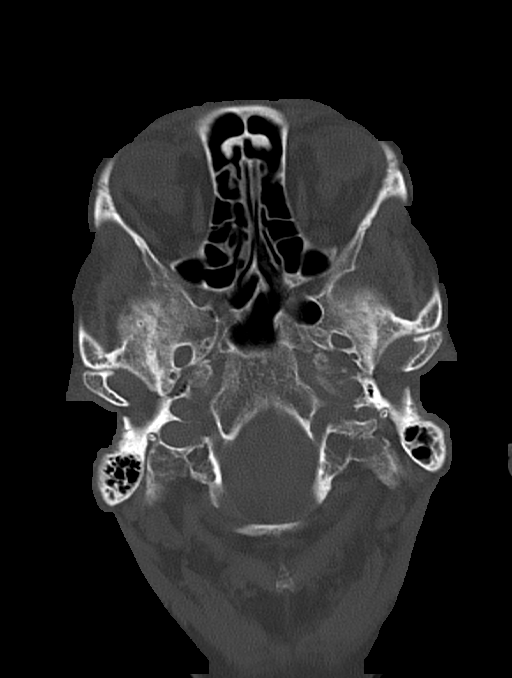
[im 17/82  bone]
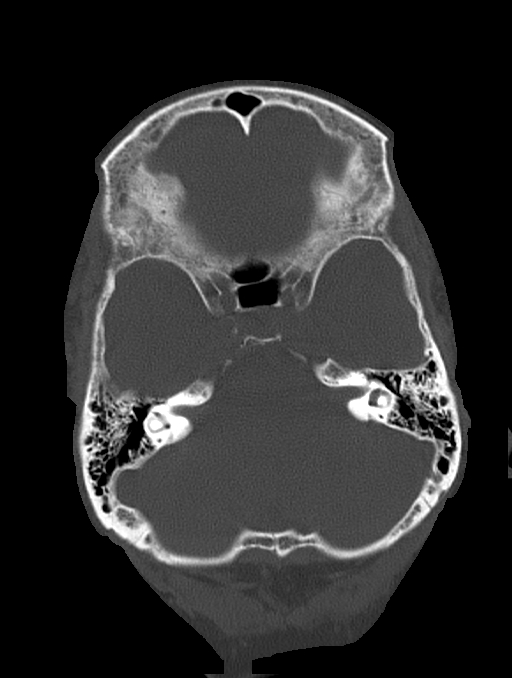
[im 25/82  bone]
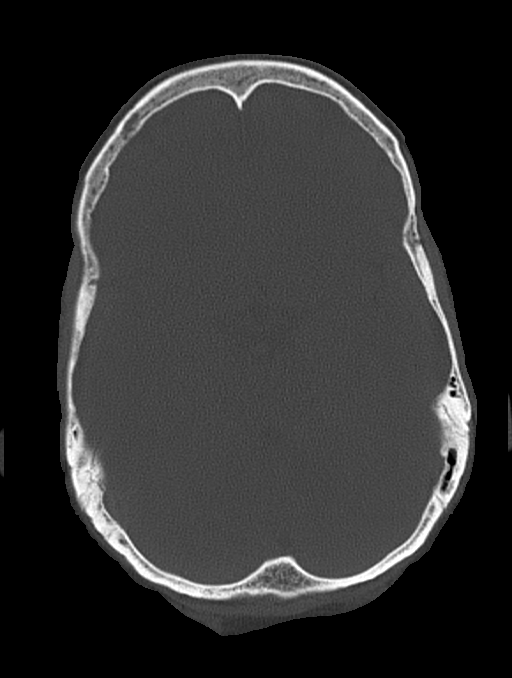

[Series 5: head without cor · coronal · non-contrast · 0.34mm/px · 3 of 69 slices shown]
[im 23/69  brain]
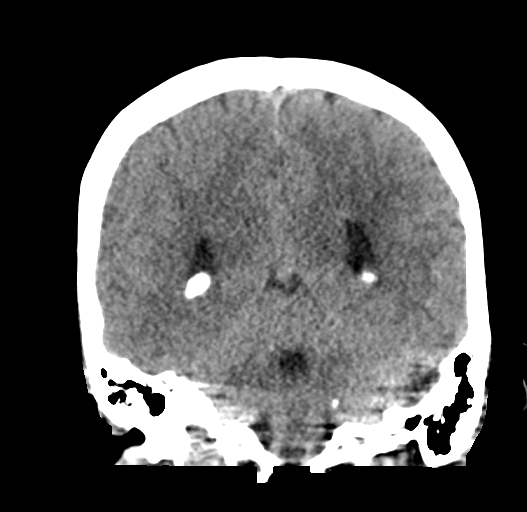
[im 31/69  brain]
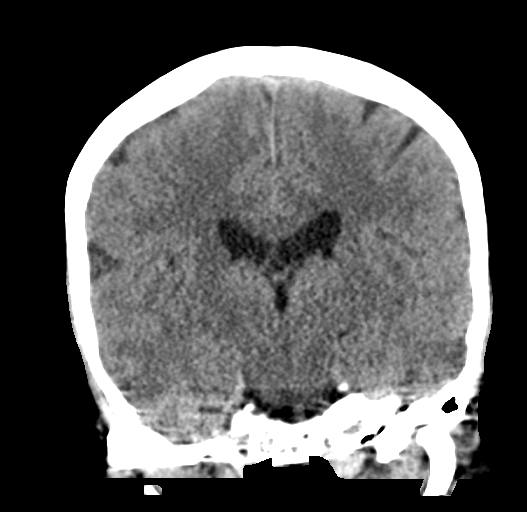
[im 38/69  brain]
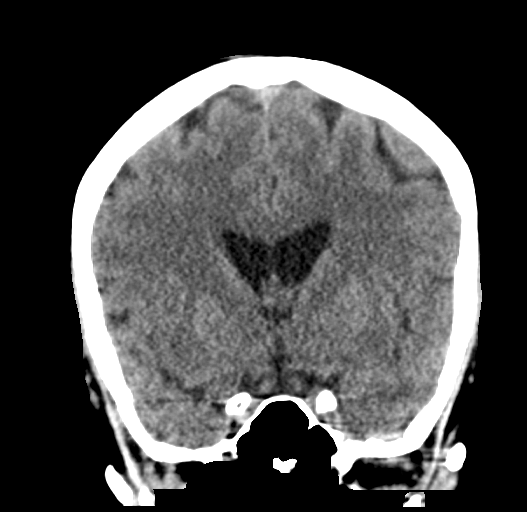

[Series 6: head without sag · sagittal · non-contrast · 0.31mm/px · 3 of 48 slices shown]
[im 16/48  brain]
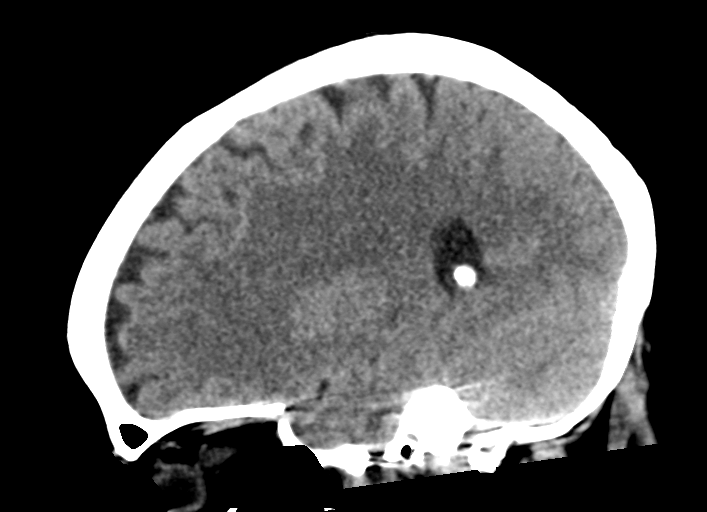
[im 24/48  brain]
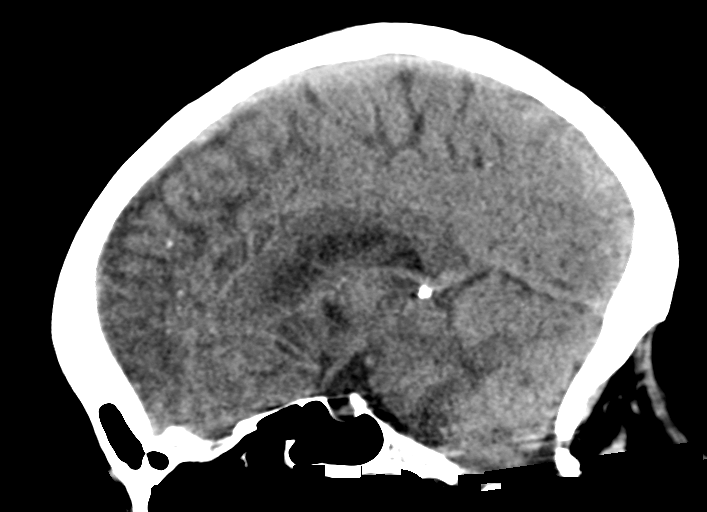
[im 32/48  brain]
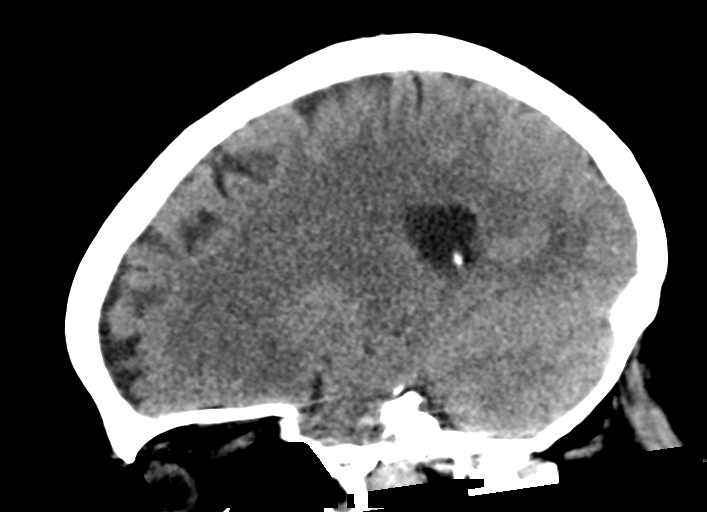

[16 of 47 positions shown; findings below may reference images not displayed]

FINDINGS: Brain: No evidence of acute infarction, hemorrhage, hydrocephalus,
extra-axial collection or mass lesion/mass effect.

Vascular: No hyperdense vessel or unexpected calcification.

Skull: Normal. Negative for fracture or focal lesion.

Sinuses/Orbits: No acute finding.

Other: None.
IMPRESSION: 1. No acute intracranial abnormalities. The appearance of the brain
is normal.

## 2023-04-01 IMAGING — CT CT ANGIO CHEST
4 of 7 series · 17 of 36 positions shown · IV contrast (omnipaque)
Comparison: No priors.

CLINICAL DATA: 65-year-old female with history of shortness of
breath. Positive D-dimer. COVID positive patient. Altered mental
status.

EXAM:
CT ANGIOGRAPHY CHEST WITH CONTRAST
TECHNIQUE: Multidetector CT imaging of the chest was performed using the
standard protocol during bolus administration of intravenous
contrast. Multiplanar CT image reconstructions and MIPs were
obtained to evaluate the vascular anatomy.
CONTRAST:  100mL OMNIPAQUE IOHEXOL 350 MG/ML SOLN

[Series 8: pe 2mm · axial · 0.71mm/px · z∈[-669,-429]mm · 5 of 181 slices shown]
[im 31/181  lung]
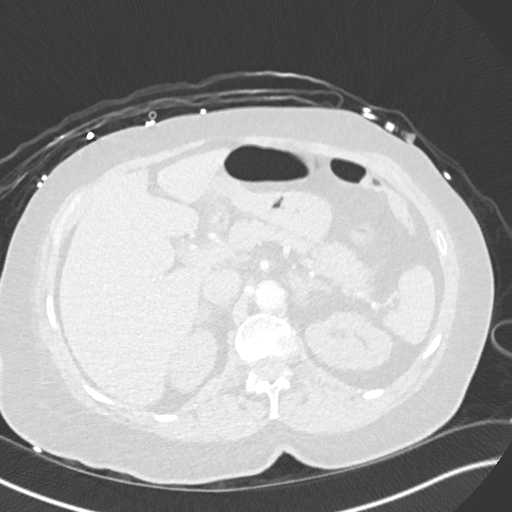
[im 61/181  mediastinal]
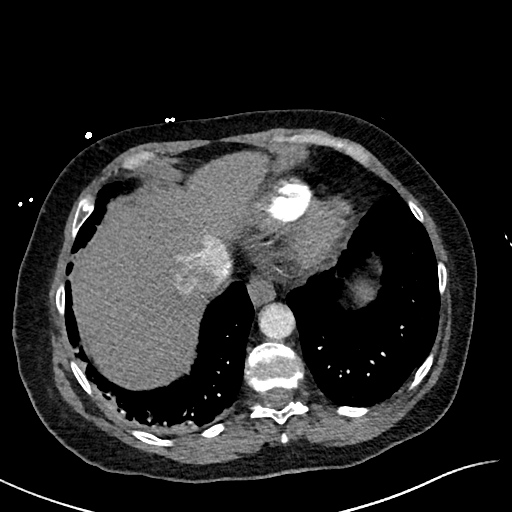
[im 91/181  lung]
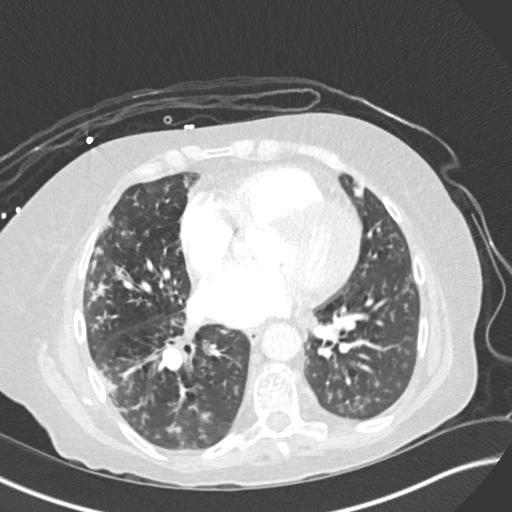
[im 121/181  mediastinal]
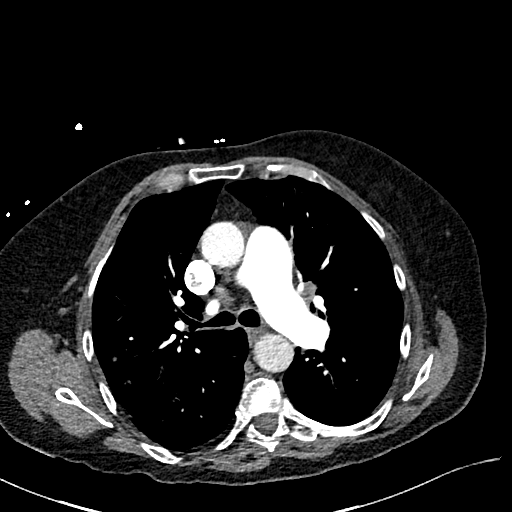
[im 151/181  lung]
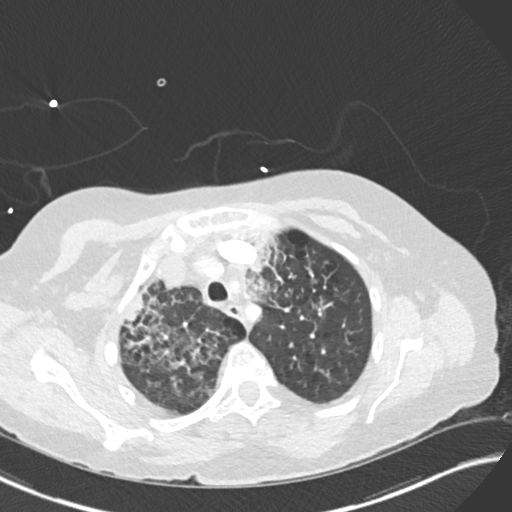

[Series 9: pe lung · axial · 0.64mm/px · z∈[-595,-451]mm · 3 of 146 slices shown]
[im 37/146  mediastinal]
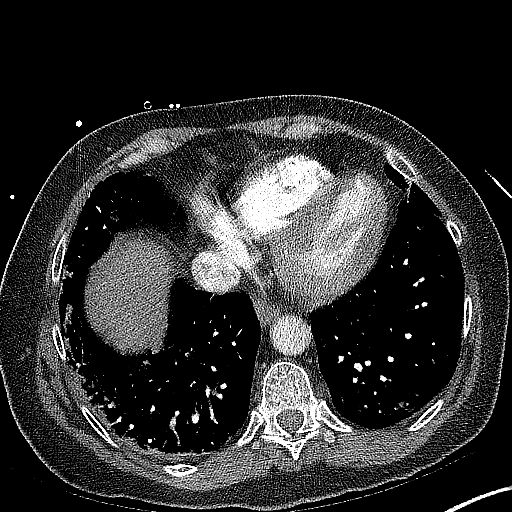
[im 73/146  mediastinal]
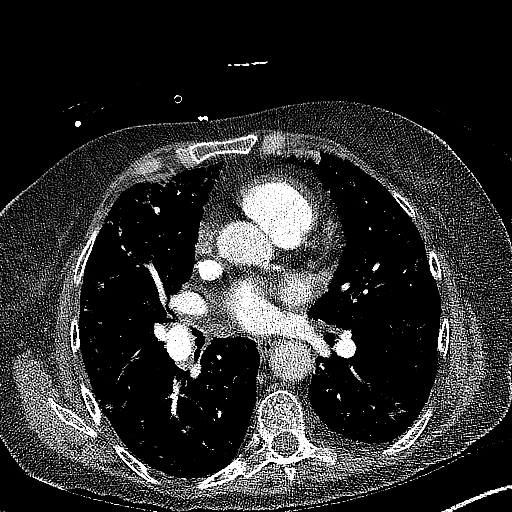
[im 109/146  mediastinal]
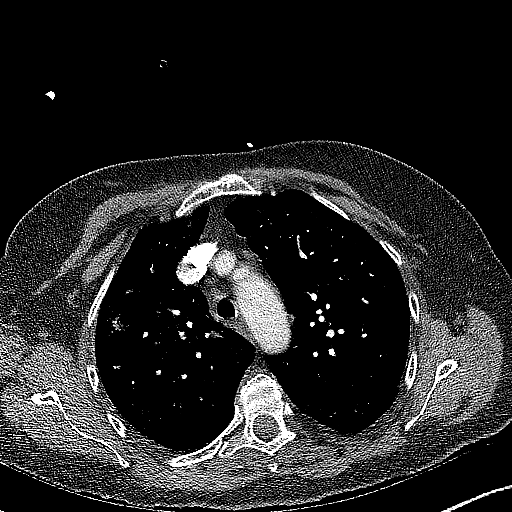

[Series 10: pe thins · axial · 0.64mm/px · z∈[-674,-399]mm · 8 of 455 slices shown]
[im 31/455  lung]
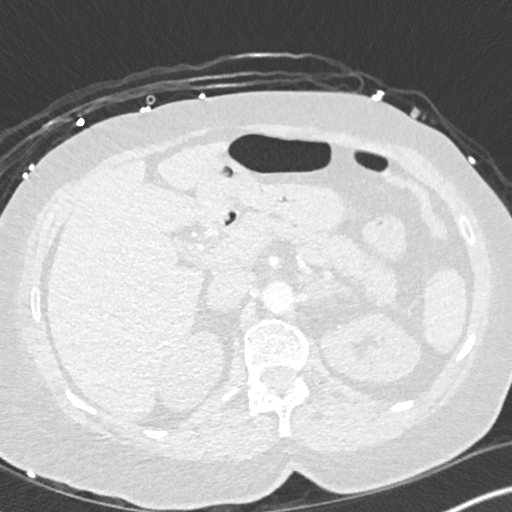
[im 91/455  lung]
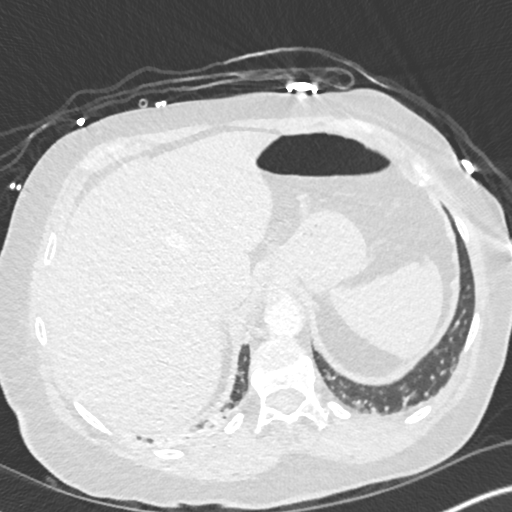
[im 152/455  lung]
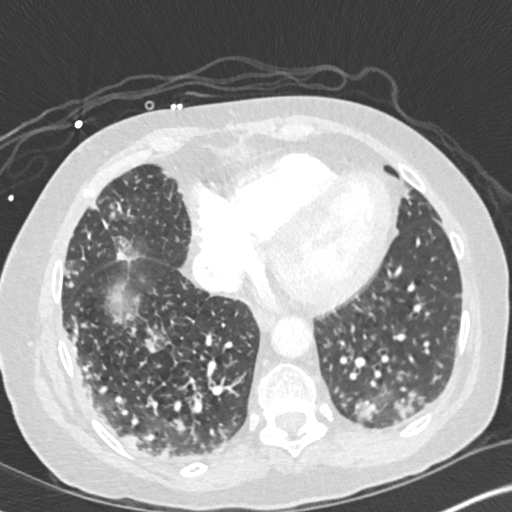
[im 212/455  lung]
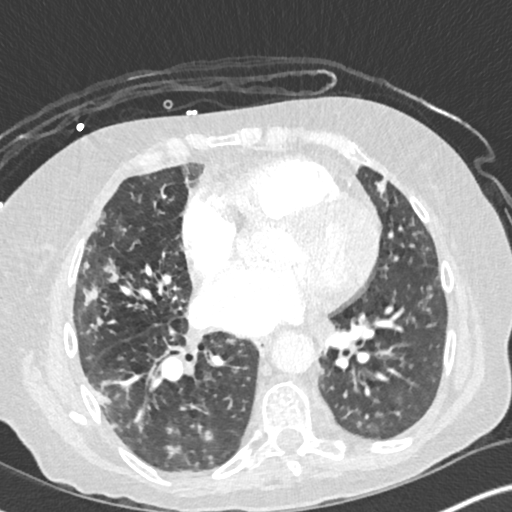
[im 243/455  lung]
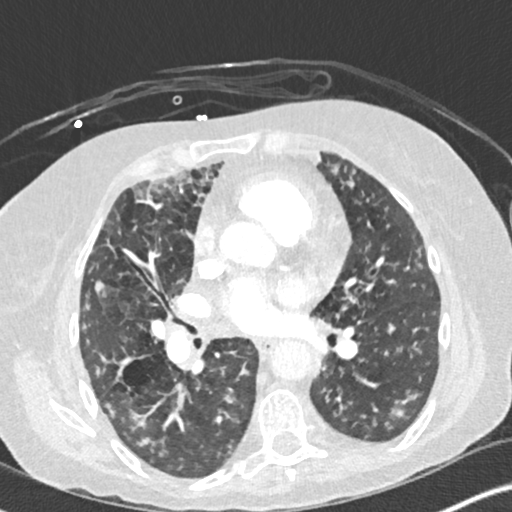
[im 303/455  lung]
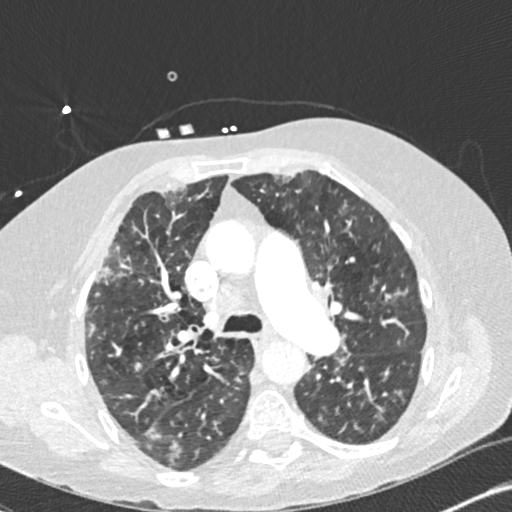
[im 364/455  lung]
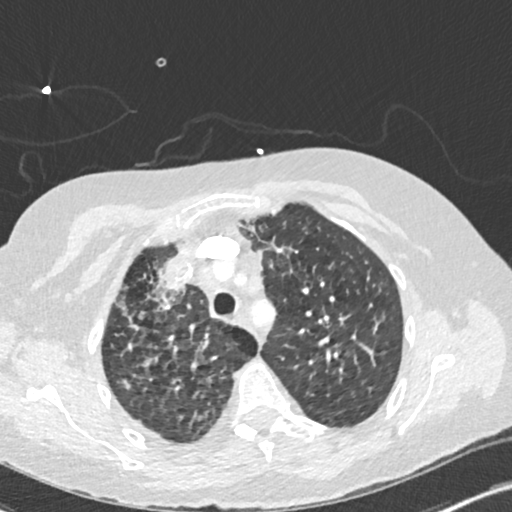
[im 424/455  lung]
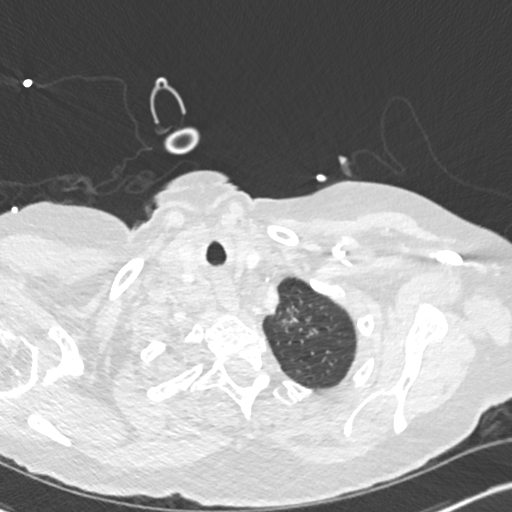

[Series 11: pe 2mm cor · coronal · 0.64mm/px · 1 of 132 slices shown]
[im 66/132  mediastinal]
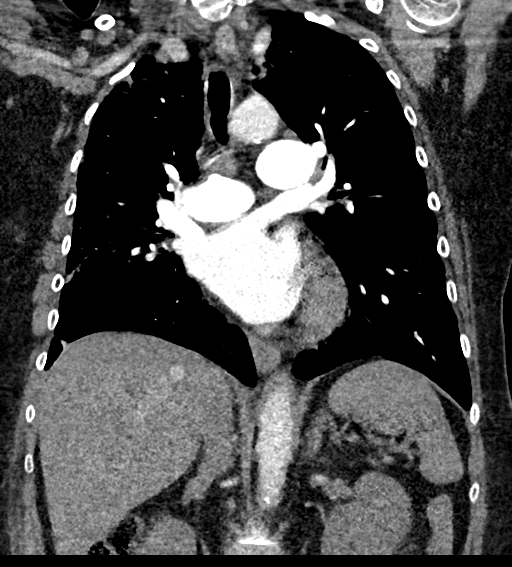

[17 of 36 positions shown; findings below may reference images not displayed]

FINDINGS: Cardiovascular: No filling defects within the pulmonary arterial
tree to suggest pulmonary embolism. Heart size is mildly enlarged
with concentric left ventricular hypertrophy. There is no
significant pericardial fluid, thickening or pericardial
calcification. There is aortic atherosclerosis, as well as
atherosclerosis of the great vessels of the mediastinum and the
coronary arteries, including calcified atherosclerotic plaque in the
left anterior descending and left circumflex coronary arteries.
Thickening calcification of the aortic valve.

Mediastinum/Nodes: No pathologically enlarged mediastinal or hilar
lymph nodes. Esophagus is unremarkable in appearance. No axillary
lymphadenopathy.

Lungs/Pleura: Diffuse bronchial wall thickening with patchy areas of
thickening of the peribronchovascular interstitium and extensive
peribronchovascular ground-glass attenuation, micro nodularity and
macro nodularity scattered throughout the lungs bilaterally,
indicative of severe bilateral multilobar bronchopneumonia. No
pleural effusions. No definite larger more suspicious appearing
pulmonary nodules or masses are noted.

Upper Abdomen: Aortic atherosclerosis. 1.2 cm low-attenuation lesion
in segment 3 of the liver is compatible with a small cyst.
Atherosclerotic calcifications in the thoracic aorta scratch the
atherosclerotic calcifications in the abdominal aorta.

Musculoskeletal: There are no aggressive appearing lytic or blastic
lesions noted in the visualized portions of the skeleton.

Review of the MIP images confirms the above findings.
IMPRESSION: 1. No evidence of pulmonary embolism.
2. Severe bronchitis with severe multilobar bilateral
bronchopneumonia, as above.
3. Cardiomegaly with concentric left ventricular hypertrophy.
4. There are calcifications of the aortic valve. Echocardiographic
correlation for evaluation of potential valvular dysfunction may be
warranted if clinically indicated.
5. Aortic atherosclerosis, in addition to 2 vessel coronary artery
disease. Please note that although the presence of coronary artery
calcium documents the presence of coronary artery disease, the
severity of this disease and any potential stenosis cannot be
assessed on this non-gated CT examination. Assessment for potential
risk factor modification, dietary therapy or pharmacologic therapy
may be warranted, if clinically indicated.

Aortic Atherosclerosis (32220-VZ7.7).

## 2023-12-20 ENCOUNTER — Other Ambulatory Visit: Payer: Self-pay

## 2023-12-20 ENCOUNTER — Emergency Department (HOSPITAL_BASED_OUTPATIENT_CLINIC_OR_DEPARTMENT_OTHER): Payer: Self-pay

## 2023-12-20 ENCOUNTER — Emergency Department (HOSPITAL_BASED_OUTPATIENT_CLINIC_OR_DEPARTMENT_OTHER): Payer: Self-pay | Admitting: Radiology

## 2023-12-20 ENCOUNTER — Encounter (HOSPITAL_BASED_OUTPATIENT_CLINIC_OR_DEPARTMENT_OTHER): Payer: Self-pay

## 2023-12-20 ENCOUNTER — Inpatient Hospital Stay (HOSPITAL_BASED_OUTPATIENT_CLINIC_OR_DEPARTMENT_OTHER)
Admission: EM | Admit: 2023-12-20 | Discharge: 2023-12-26 | DRG: 291 | Disposition: A | Discharge status: Home / Self Care | Payer: Medicare Other | Attending: Family Medicine | Admitting: Family Medicine

## 2023-12-20 DIAGNOSIS — Z8616 Personal history of COVID-19: Secondary | ICD-10-CM | POA: Diagnosis not present

## 2023-12-20 DIAGNOSIS — I2489 Other forms of acute ischemic heart disease: Secondary | ICD-10-CM | POA: Diagnosis present

## 2023-12-20 DIAGNOSIS — I429 Cardiomyopathy, unspecified: Secondary | ICD-10-CM | POA: Diagnosis present

## 2023-12-20 DIAGNOSIS — I160 Hypertensive urgency: Secondary | ICD-10-CM | POA: Diagnosis present

## 2023-12-20 DIAGNOSIS — I4891 Unspecified atrial fibrillation: Secondary | ICD-10-CM | POA: Diagnosis present

## 2023-12-20 DIAGNOSIS — Z560 Unemployment, unspecified: Secondary | ICD-10-CM

## 2023-12-20 DIAGNOSIS — F17210 Nicotine dependence, cigarettes, uncomplicated: Secondary | ICD-10-CM | POA: Diagnosis present

## 2023-12-20 DIAGNOSIS — J9621 Acute and chronic respiratory failure with hypoxia: Secondary | ICD-10-CM | POA: Diagnosis present

## 2023-12-20 DIAGNOSIS — Z1152 Encounter for screening for COVID-19: Secondary | ICD-10-CM

## 2023-12-20 DIAGNOSIS — Z79899 Other long term (current) drug therapy: Secondary | ICD-10-CM | POA: Diagnosis not present

## 2023-12-20 DIAGNOSIS — E119 Type 2 diabetes mellitus without complications: Secondary | ICD-10-CM | POA: Diagnosis present

## 2023-12-20 DIAGNOSIS — I5033 Acute on chronic diastolic (congestive) heart failure: Secondary | ICD-10-CM | POA: Diagnosis not present

## 2023-12-20 DIAGNOSIS — I2510 Atherosclerotic heart disease of native coronary artery without angina pectoris: Secondary | ICD-10-CM | POA: Diagnosis present

## 2023-12-20 DIAGNOSIS — I081 Rheumatic disorders of both mitral and tricuspid valves: Secondary | ICD-10-CM | POA: Diagnosis present

## 2023-12-20 DIAGNOSIS — Z91148 Patient's other noncompliance with medication regimen for other reason: Secondary | ICD-10-CM

## 2023-12-20 DIAGNOSIS — J449 Chronic obstructive pulmonary disease, unspecified: Secondary | ICD-10-CM | POA: Diagnosis present

## 2023-12-20 DIAGNOSIS — Z7982 Long term (current) use of aspirin: Secondary | ICD-10-CM

## 2023-12-20 DIAGNOSIS — Z91120 Patient's intentional underdosing of medication regimen due to financial hardship: Secondary | ICD-10-CM | POA: Diagnosis not present

## 2023-12-20 DIAGNOSIS — E785 Hyperlipidemia, unspecified: Secondary | ICD-10-CM | POA: Diagnosis present

## 2023-12-20 DIAGNOSIS — J9622 Acute and chronic respiratory failure with hypercapnia: Secondary | ICD-10-CM | POA: Diagnosis present

## 2023-12-20 DIAGNOSIS — I110 Hypertensive heart disease with heart failure: Secondary | ICD-10-CM | POA: Diagnosis present

## 2023-12-20 DIAGNOSIS — E876 Hypokalemia: Secondary | ICD-10-CM | POA: Diagnosis present

## 2023-12-20 DIAGNOSIS — J439 Emphysema, unspecified: Secondary | ICD-10-CM | POA: Diagnosis present

## 2023-12-20 DIAGNOSIS — T50916A Underdosing of multiple unspecified drugs, medicaments and biological substances, initial encounter: Secondary | ICD-10-CM | POA: Diagnosis present

## 2023-12-20 DIAGNOSIS — I5043 Acute on chronic combined systolic (congestive) and diastolic (congestive) heart failure: Secondary | ICD-10-CM | POA: Diagnosis present

## 2023-12-20 DIAGNOSIS — Z5971 Insufficient health insurance coverage: Secondary | ICD-10-CM

## 2023-12-20 DIAGNOSIS — I509 Heart failure, unspecified: Secondary | ICD-10-CM

## 2023-12-20 DIAGNOSIS — I700 Atherosclerosis of aorta: Secondary | ICD-10-CM | POA: Diagnosis present

## 2023-12-20 DIAGNOSIS — J9601 Acute respiratory failure with hypoxia: Secondary | ICD-10-CM | POA: Diagnosis present

## 2023-12-20 DIAGNOSIS — N179 Acute kidney failure, unspecified: Secondary | ICD-10-CM | POA: Diagnosis not present

## 2023-12-20 HISTORY — DX: Essential (primary) hypertension: I10

## 2023-12-20 LAB — CBC WITH DIFFERENTIAL/PLATELET
Abs Immature Granulocytes: 0.03 10*3/uL (ref 0.00–0.07)
Basophils Absolute: 0.1 10*3/uL (ref 0.0–0.1)
Basophils Relative: 1 %
Eosinophils Absolute: 0 10*3/uL (ref 0.0–0.5)
Eosinophils Relative: 0 %
HCT: 46 % (ref 36.0–46.0)
Hemoglobin: 14.5 g/dL (ref 12.0–15.0)
Immature Granulocytes: 0 %
Lymphocytes Relative: 12 %
Lymphs Abs: 0.9 10*3/uL (ref 0.7–4.0)
MCH: 27 pg (ref 26.0–34.0)
MCHC: 31.5 g/dL (ref 30.0–36.0)
MCV: 85.5 fL (ref 80.0–100.0)
Monocytes Absolute: 0.5 10*3/uL (ref 0.1–1.0)
Monocytes Relative: 7 %
Neutro Abs: 6.1 10*3/uL (ref 1.7–7.7)
Neutrophils Relative %: 80 %
Platelets: 191 10*3/uL (ref 150–400)
RBC: 5.38 MIL/uL — ABNORMAL HIGH (ref 3.87–5.11)
RDW: 15.6 % — ABNORMAL HIGH (ref 11.5–15.5)
WBC: 7.6 10*3/uL (ref 4.0–10.5)
nRBC: 0 % (ref 0.0–0.2)

## 2023-12-20 LAB — COMPREHENSIVE METABOLIC PANEL
ALT: 37 U/L (ref 0–44)
AST: 30 U/L (ref 15–41)
Albumin: 4.3 g/dL (ref 3.5–5.0)
Alkaline Phosphatase: 93 U/L (ref 38–126)
Anion gap: 11 (ref 5–15)
BUN: 12 mg/dL (ref 8–23)
CO2: 26 mmol/L (ref 22–32)
Calcium: 9.2 mg/dL (ref 8.9–10.3)
Chloride: 103 mmol/L (ref 98–111)
Creatinine, Ser: 0.73 mg/dL (ref 0.44–1.00)
GFR, Estimated: >60 mL/min (ref >60)
Glucose, Bld: 113 mg/dL — ABNORMAL HIGH (ref 70–99)
Potassium: 3.7 mmol/L (ref 3.5–5.1)
Sodium: 140 mmol/L (ref 135–145)
Total Bilirubin: 0.9 mg/dL (ref <1.2)
Total Protein: 7.7 g/dL (ref 6.5–8.1)

## 2023-12-20 LAB — TROPONIN I (HIGH SENSITIVITY)
Troponin I (High Sensitivity): 28 ng/L — ABNORMAL HIGH (ref <18)
Troponin I (High Sensitivity): 31 ng/L — ABNORMAL HIGH (ref <18)

## 2023-12-20 LAB — HEPARIN LEVEL (UNFRACTIONATED): Heparin Unfractionated: 0.34 [IU]/mL (ref 0.30–0.70)

## 2023-12-20 LAB — RESP PANEL BY RT-PCR (RSV, FLU A&B, COVID)  RVPGX2
Influenza A by PCR: NEGATIVE
Influenza B by PCR: NEGATIVE
Resp Syncytial Virus by PCR: NEGATIVE
SARS Coronavirus 2 by RT PCR: NEGATIVE

## 2023-12-20 LAB — TSH: TSH: 1.305 u[IU]/mL (ref 0.350–4.500)

## 2023-12-20 LAB — MAGNESIUM: Magnesium: 1.9 mg/dL (ref 1.7–2.4)

## 2023-12-20 LAB — BRAIN NATRIURETIC PEPTIDE: B Natriuretic Peptide: 641.9 pg/mL — ABNORMAL HIGH (ref 0.0–100.0)

## 2023-12-20 LAB — D-DIMER, QUANTITATIVE: D-Dimer, Quant: 0.62 ug{FEU}/mL — ABNORMAL HIGH (ref 0.00–0.50)

## 2023-12-20 LAB — CBG MONITORING, ED: Glucose-Capillary: 130 mg/dL — ABNORMAL HIGH (ref 70–99)

## 2023-12-20 MED ORDER — DILTIAZEM HCL-DEXTROSE 125-5 MG/125ML-% IV SOLN (PREMIX)
5.0000 mg/h | INTRAVENOUS | Status: DC
  Administered 2023-12-20: 5 mg/h via INTRAVENOUS
  Administered 2023-12-21: 15 mg/h via INTRAVENOUS
  Administered 2023-12-21: 12.5 mg/h via INTRAVENOUS
  Filled 2023-12-20 (×4): qty 125

## 2023-12-20 MED ORDER — ALBUTEROL SULFATE (2.5 MG/3ML) 0.083% IN NEBU: 2.5000 mg | INHALATION_SOLUTION | RESPIRATORY_TRACT | Status: DC | PRN

## 2023-12-20 MED ORDER — HEPARIN (PORCINE) 25000 UT/250ML-% IV SOLN
1250.0000 [IU]/h | INTRAVENOUS | Status: AC
  Administered 2023-12-20: 1000 [IU]/h via INTRAVENOUS
  Filled 2023-12-20 (×2): qty 250

## 2023-12-20 MED ORDER — METOPROLOL SUCCINATE ER 25 MG PO TB24
25.0000 mg | ORAL_TABLET | Freq: Every day | ORAL | Status: DC
  Administered 2023-12-20 – 2023-12-21 (×2): 25 mg via ORAL
  Filled 2023-12-20 (×2): qty 1

## 2023-12-20 MED ORDER — ONDANSETRON HCL 4 MG PO TABS: 4.0000 mg | ORAL_TABLET | Freq: Four times a day (QID) | ORAL | Status: DC | PRN

## 2023-12-20 MED ORDER — POLYETHYLENE GLYCOL 3350 17 G PO PACK
17.0000 g | PACK | Freq: Every day | ORAL | Status: DC | PRN
  Administered 2023-12-26: 17 g via ORAL
  Filled 2023-12-20: qty 1

## 2023-12-20 MED ORDER — ACETAMINOPHEN 650 MG RE SUPP: 650.0000 mg | Freq: Four times a day (QID) | RECTAL | Status: DC | PRN

## 2023-12-20 MED ORDER — FUROSEMIDE 10 MG/ML IJ SOLN
40.0000 mg | Freq: Two times a day (BID) | INTRAMUSCULAR | Status: DC
  Administered 2023-12-21 – 2023-12-24 (×6): 40 mg via INTRAVENOUS
  Filled 2023-12-20 (×6): qty 4

## 2023-12-20 MED ORDER — ALBUTEROL SULFATE HFA 108 (90 BASE) MCG/ACT IN AERS: 2.0000 | INHALATION_SPRAY | RESPIRATORY_TRACT | Status: DC | PRN

## 2023-12-20 MED ORDER — OXYCODONE HCL 5 MG PO TABS: 5.0000 mg | ORAL_TABLET | ORAL | Status: DC | PRN

## 2023-12-20 MED ORDER — POTASSIUM CHLORIDE CRYS ER 20 MEQ PO TBCR
20.0000 meq | EXTENDED_RELEASE_TABLET | Freq: Once | ORAL | Status: AC
  Administered 2023-12-20: 20 meq via ORAL
  Filled 2023-12-20: qty 1

## 2023-12-20 MED ORDER — MAGNESIUM SULFATE IN D5W 1-5 GM/100ML-% IV SOLN
1.0000 g | Freq: Once | INTRAVENOUS | Status: AC
  Administered 2023-12-20: 1 g via INTRAVENOUS
  Filled 2023-12-20: qty 100

## 2023-12-20 MED ORDER — METOPROLOL TARTRATE 5 MG/5ML IV SOLN
5.0000 mg | Freq: Once | INTRAVENOUS | Status: AC
  Administered 2023-12-20: 5 mg via INTRAVENOUS
  Filled 2023-12-20: qty 5

## 2023-12-20 MED ORDER — SODIUM CHLORIDE 0.9% FLUSH
3.0000 mL | Freq: Two times a day (BID) | INTRAVENOUS | Status: DC
  Administered 2023-12-21 – 2023-12-26 (×10): 3 mL via INTRAVENOUS

## 2023-12-20 MED ORDER — ONDANSETRON HCL 4 MG/2ML IJ SOLN: 4.0000 mg | Freq: Four times a day (QID) | INTRAMUSCULAR | Status: DC | PRN

## 2023-12-20 MED ORDER — HEPARIN BOLUS VIA INFUSION
4500.0000 [IU] | Freq: Once | INTRAVENOUS | Status: AC
  Administered 2023-12-20: 4500 [IU] via INTRAVENOUS

## 2023-12-20 MED ORDER — FUROSEMIDE 10 MG/ML IJ SOLN
40.0000 mg | Freq: Once | INTRAMUSCULAR | Status: AC
  Administered 2023-12-20: 40 mg via INTRAVENOUS
  Filled 2023-12-20: qty 4

## 2023-12-20 MED ORDER — ARFORMOTEROL TARTRATE 15 MCG/2ML IN NEBU
15.0000 ug | INHALATION_SOLUTION | Freq: Two times a day (BID) | RESPIRATORY_TRACT | Status: DC
  Administered 2023-12-20 – 2023-12-26 (×8): 15 ug via RESPIRATORY_TRACT
  Filled 2023-12-20 (×13): qty 2

## 2023-12-20 MED ORDER — UMECLIDINIUM BROMIDE 62.5 MCG/ACT IN AEPB
1.0000 | INHALATION_SPRAY | Freq: Every day | RESPIRATORY_TRACT | Status: DC
  Administered 2023-12-22 – 2023-12-26 (×4): 1 via RESPIRATORY_TRACT
  Filled 2023-12-20 (×2): qty 7

## 2023-12-20 MED ORDER — DILTIAZEM LOAD VIA INFUSION
20.0000 mg | Freq: Once | INTRAVENOUS | Status: AC
  Administered 2023-12-20: 20 mg via INTRAVENOUS
  Filled 2023-12-20: qty 20

## 2023-12-20 MED ORDER — IOHEXOL 350 MG/ML SOLN
75.0000 mL | Freq: Once | INTRAVENOUS | Status: AC | PRN
  Administered 2023-12-20: 75 mL via INTRAVENOUS

## 2023-12-20 MED ORDER — ACETAMINOPHEN 325 MG PO TABS
650.0000 mg | ORAL_TABLET | Freq: Four times a day (QID) | ORAL | Status: DC | PRN
  Administered 2023-12-21 – 2023-12-25 (×6): 650 mg via ORAL
  Filled 2023-12-20 (×6): qty 2

## 2023-12-20 NOTE — ED Notes (Signed)
Report given to inpatient facility.

## 2023-12-20 NOTE — Plan of Care (Signed)
Transfer from [facility].  67 y/o Female with HTN and COPD who presents with SOB and swelling. Patient out of medication for the last 6 months after loss of insurance.  Chest x-ray noted cardiomegaly with pulmonary vascular congestion.  Dimer was positive, but negative when adjusted age adjusted neg. patient had been placed on Cardizem  and heparin drip.  Orders placed for CT angiogram of the chest.  Patient had also been given Lasix 40 mg IV.  Accepted  to a telemetry bed as inpatient

## 2023-12-20 NOTE — ED Triage Notes (Signed)
Pt via pov from home with sob, swollen legs x 1 week. Pt reports that she lost her insurance and hasn't taken her meds in at least 6 months (amlodipine, metoprolol. Stiolto). Pt alert & oriented, sounds sob. NAD noted.

## 2023-12-20 NOTE — Progress Notes (Signed)
PHARMACY - ANTICOAGULATION CONSULT NOTE  Pharmacy Consult for heparin Indication: atrial fibrillation  No Known Allergies  Patient Measurements: Height: 5\' 9"  (175.3 cm) Weight: 74.4 kg (164 lb 0.4 oz) IBW/kg (Calculated) : 66.2 Heparin Dosing Weight: 74.4kg  Vital Signs: Temp: 97.7 F (36.5 C) (12/20 1224) BP: 170/114 (12/20 1224) Pulse Rate: 69 (12/20 1224)  Labs: Recent Labs    12/20/23 1302  HGB 14.5  HCT 46.0  PLT 191    CrCl cannot be calculated (Patient's most recent lab result is older than the maximum 21 days allowed.).   Medical History: Past Medical History:  Diagnosis Date   COPD (chronic obstructive pulmonary disease) (HCC)     Medications:  Infusions:   diltiazem (CARDIZEM) infusion 5 mg/hr (12/20/23 1311)   heparin      Assessment: Natalie Alexander presented to the ED with ShOB and swollen legs. To start IV heparin for afib. Baseline Hgb and platelets are WNL. She is not on anticoagulation PTA.   Goal of Therapy:  Heparin level 0.3-0.7 units/ml Monitor platelets by anticoagulation protocol: Yes   Plan:  Heparin bolus 4500 units IV x 1 Heparin gtt 1000 units/hr Check a 6 hr heparin level Daily heparin level and CBC   Miosha Behe, Drake Leach 12/20/2023,1:03 PM

## 2023-12-20 NOTE — ED Notes (Signed)
Unable to find a functional portable heart monitor in dept. To avoid further delay in care, RN accompanied pt to CT scan for continuous monitoring. No adverse events. Pt returned to room and vitals consistent with previous assessments.

## 2023-12-20 NOTE — H&P (Addendum)
History and Physical    Natalie Alexander QMV:[id number] DOB: 1956/01/16 DOA: 12/20/2023  PCP: Patient, No Pcp Per   Patient coming from: Home   Chief Complaint: SOB, swelling   HPI: Natalie Alexander is a 67 y.o. female with medical history significant for hypertension and COPD, now presenting for evaluation of shortness of breath and leg swelling.  Patient reports insidiously worsening DOE over weeks which has become severe over the past couple days. She has also developed bilateral leg swelling over the past week. She has not been coughing much and denies fever, chills, or chest pain.   She has not taken any prescription medications in months due to loss of insurance.    She has not smoked a cigarette in 2 years now.   [facility] ED Course: Upon arrival to the ED, patient is found to be afebrile and saturating 88% on room air with tachypnea, tachycardia, and elevated blood pressure.  EKG demonstrates atrial fibrillation with RVR.  Chest x-ray notable for cardiomegaly and pulmonary vascular congestion.  Lower extremity venous Dopplers were negative for DVT in either leg and CTA chest was negative for PE.  Labs were most notable for normal creatinine, normal WBC, BNP 642, troponin 31, D-dimer 0.62, and negative respiratory virus panel.  Patient was given 20 mg IV diltiazem, started on diltiazem infusion, started on IV heparin, given 5 mg IV Lopressor, and 40 mg IV Lasix.  She was transferred to [facility] for admission.  Review of Systems:  All other systems reviewed and apart from HPI, are negative.  Past Medical History:  Diagnosis Date   COPD (chronic obstructive pulmonary disease) (HCC)     History reviewed. No pertinent surgical history.  Social History:   reports that she has been smoking cigarettes. She has never used smokeless tobacco. No history on file for alcohol use and drug use.  No Known Allergies  History reviewed. No pertinent family  history.   Prior to Admission medications   Medication Sig Start Date End Date Taking? Authorizing Provider  aspirin EC 81 MG tablet Take 81 mg by mouth daily. Swallow whole.    [provider]  Dextromethorphan-guaiFENesin 10-100 MG/5ML liquid Take 10 mLs by mouth every 4 (four) hours as needed. 01/12/22   Atway, Rayann N, DO  metoprolol succinate (TOPROL-XL) 25 MG 24 hr tablet Take 1 tablet by mouth daily. 02/20/22   [provider]  Tiotropium Bromide-Olodaterol (STIOLTO RESPIMAT) 2.5-2.5 MCG/ACT AERS Inhale 2 puffs into the lungs daily. 04/02/22   Martina Sinner, MD    Physical Exam: Vitals:   12/20/23 1930 12/20/23 1938 12/20/23 2050 12/20/23 2053  BP:   (!) 134/115 (!) 134/109  Pulse: 85 97 (!) 186 97  Resp: 19 20 20 20   Temp: 98.7 F (37.1 C)  99.5 F (37.5 C) 99.5 F (37.5 C)  TempSrc: Oral  Oral Oral  SpO2: 96% 96% 95% 92%  Weight:      Height:        Constitutional: NAD, calm  Eyes: PERTLA, lids and conjunctivae normal ENMT: Mucous membranes are moist. Posterior pharynx clear of any exudate or lesions.   Neck: supple, no masses  Respiratory: Dyspnea with speech. no wheezing. No accessory muscle use.  Cardiovascular: Rate ~120 and irregularly irregular. Pretibial pitting edema b/l.  Abdomen: No distension, no tenderness, soft. Bowel sounds active.  Musculoskeletal: no clubbing / cyanosis. No joint deformity upper and lower extremities.   Skin: no significant rashes, lesions, ulcers. Warm,  dry, well-perfused. Neurologic: CN 2-12 grossly intact aside from gross hearing deficit. Moving all extremities. Alert and oriented.  Psychiatric: Pleasant. Cooperative.    Labs and Imaging on Admission: I have personally reviewed following labs and imaging studies  CBC: Recent Labs  Lab 12/20/23 1302  WBC 7.6  NEUTROABS 6.1  HGB 14.5  HCT 46.0  MCV 85.5  PLT 191   Basic Metabolic Panel: Recent Labs  Lab 12/20/23 1302  NA 140  K 3.7  CL 103  CO2  26  GLUCOSE 113*  BUN 12  CREATININE 0.73  CALCIUM 9.2  MG 1.9   GFR: Estimated Creatinine Clearance: 70.3 mL/min (by C-G formula based on SCr of 0.73 mg/dL). Liver Function Tests: Recent Labs  Lab 12/20/23 1302  AST 30  ALT 37  ALKPHOS 93  BILITOT 0.9  PROT 7.7  ALBUMIN 4.3   No results for input(s): "LIPASE", "AMYLASE" in the last 168 hours. No results for input(s): "AMMONIA" in the last 168 hours. Coagulation Profile: No results for input(s): "INR", "PROTIME" in the last 168 hours. Cardiac Enzymes: No results for input(s): "CKTOTAL", "CKMB", "CKMBINDEX", "TROPONINI" in the last 168 hours. BNP (last 3 results) No results for input(s): "PROBNP" in the last 8760 hours. HbA1C: No results for input(s): "HGBA1C" in the last 72 hours. CBG: Recent Labs  Lab 12/20/23 1223  GLUCAP 130*   Lipid Profile: No results for input(s): "CHOL", "HDL", "LDLCALC", "TRIG", "CHOLHDL", "LDLDIRECT" in the last 72 hours. Thyroid Function Tests: Recent Labs    12/20/23 1302  TSH 1.305   Anemia Panel: No results for input(s): "VITAMINB12", "FOLATE", "FERRITIN", "TIBC", "IRON", "RETICCTPCT" in the last 72 hours. Urine analysis:    Component Value Date/Time   COLORURINE YELLOW 01/08/2022 1734   APPEARANCEUR CLEAR 01/08/2022 1734   LABSPEC 1.025 01/08/2022 1734   PHURINE 6.5 01/08/2022 1734   GLUCOSEU NEGATIVE 01/08/2022 1734   HGBUR SMALL (A) 01/08/2022 1734   BILIRUBINUR SMALL (A) 01/08/2022 1734   KETONESUR NEGATIVE 01/08/2022 1734   PROTEINUR 100 (A) 01/08/2022 1734   NITRITE NEGATIVE 01/08/2022 1734   LEUKOCYTESUR NEGATIVE 01/08/2022 1734   Sepsis Labs: @LABRCNTIP (procalcitonin:4,lacticidven:4) ) Recent Results (from the past 240 hours)  Resp panel by RT-PCR (RSV, Flu A&B, Covid) Anterior Nasal Swab     Status: None   Collection Time: 12/20/23  1:02 PM   Specimen: Anterior Nasal Swab  Result Value Ref Range Status   SARS Coronavirus 2 by RT PCR NEGATIVE NEGATIVE Final     Comment: (NOTE) SARS-CoV-2 target nucleic acids are NOT DETECTED.  The SARS-CoV-2 RNA is generally detectable in upper respiratory specimens during the acute phase of infection. The lowest concentration of SARS-CoV-2 viral copies this assay can detect is 138 copies/mL. A negative result does not preclude SARS-Cov-2 infection and should not be used as the sole basis for treatment or other patient management decisions. A negative result may occur with  improper specimen collection/handling, submission of specimen other than nasopharyngeal swab, presence of viral mutation(s) within the areas targeted by this assay, and inadequate number of viral copies(<138 copies/mL). A negative result must be combined with clinical observations, patient history, and epidemiological information. The expected result is Negative.  Fact Sheet for Patients:  BloggerCourse.com  Fact Sheet for Healthcare Providers:  SeriousBroker.it  This test is no t yet approved or cleared by the [place] FDA and  has been authorized for detection and/or diagnosis of SARS-CoV-2 by FDA under an Emergency Use Authorization (EUA). This EUA will remain  in effect (meaning this test can be used) for the duration of the COVID-19 declaration under Section 564(b)(1) of the Act, 21 U.S.C.section 360bbb-3(b)(1), unless the authorization is terminated  or revoked sooner.       Influenza A by PCR NEGATIVE NEGATIVE Final   Influenza B by PCR NEGATIVE NEGATIVE Final    Comment: (NOTE) The Xpert Xpress SARS-CoV-2/FLU/RSV plus assay is intended as an aid in the diagnosis of influenza from Nasopharyngeal swab specimens and should not be used as a sole basis for treatment. Nasal washings and aspirates are unacceptable for Xpert Xpress SARS-CoV-2/FLU/RSV testing.  Fact Sheet for Patients: BloggerCourse.com  Fact Sheet for Healthcare  Providers: SeriousBroker.it  This test is not yet approved or cleared by the [place] FDA and has been authorized for detection and/or diagnosis of SARS-CoV-2 by FDA under an Emergency Use Authorization (EUA). This EUA will remain in effect (meaning this test can be used) for the duration of the COVID-19 declaration under Section 564(b)(1) of the Act, 21 U.S.C. section 360bbb-3(b)(1), unless the authorization is terminated or revoked.     Resp Syncytial Virus by PCR NEGATIVE NEGATIVE Final    Comment: (NOTE) Fact Sheet for Patients: BloggerCourse.com  Fact Sheet for Healthcare Providers: SeriousBroker.it  This test is not yet approved or cleared by the [place] FDA and has been authorized for detection and/or diagnosis of SARS-CoV-2 by FDA under an Emergency Use Authorization (EUA). This EUA will remain in effect (meaning this test can be used) for the duration of the COVID-19 declaration under Section 564(b)(1) of the Act, 21 U.S.C. section 360bbb-3(b)(1), unless the authorization is terminated or revoked.  Performed at [facility], [address] [place], [place] [address]      Radiological Exams on Admission: CT Angio Chest PE W and/or Wo Contrast Result Date: 12/20/2023 CLINICAL DATA:  Shortness of breath EXAM: CT ANGIOGRAPHY CHEST WITH CONTRAST TECHNIQUE: Multidetector CT imaging of the chest was performed using the standard protocol during bolus administration of intravenous contrast. Multiplanar CT image reconstructions and MIPs were obtained to evaluate the vascular anatomy. RADIATION DOSE REDUCTION: This exam was performed according to the departmental dose-optimization program which includes automated exposure control, adjustment of the mA and/or kV according to patient size and/or use of iterative reconstruction technique. CONTRAST:  75mL OMNIPAQUE IOHEXOL 350 MG/ML  SOLN COMPARISON:  12/20/2023, 01/08/2022 chest CT FINDINGS: Cardiovascular: Satisfactory opacification of the pulmonary arteries to the segmental level. No evidence of pulmonary embolism. Moderate aortic atherosclerosis. No aneurysm. Dilated pulmonary trunk up to 3.9 cm. Coronary vascular calcification. Cardiomegaly. No pericardial effusion. Mediastinum/Nodes: Patent trachea. No thyroid mass. Mild mediastinal lymph nodes. Right paratracheal nodes measuring up to 16 mm. This is unchanged compared with 2023. Esophagus within normal limits. Small right hilar nodes measuring up to 12 mm, also unchanged. Lungs/Pleura: Emphysema. No acute airspace disease, pleural effusion, or pneumothorax. Upper Abdomen: No acute finding. Incompletely visualized mild hydronephrosis left kidney. Small left kidney stone. Musculoskeletal: No acute or suspicious osseous abnormality. Review of the MIP images confirms the above findings. IMPRESSION: 1. Negative for acute pulmonary embolus. 2. Emphysema. No CT evidence for acute pulmonary abnormality. 3. Cardiomegaly. Dilated pulmonary trunk suggesting pulmonary arterial hypertension. 4. Incompletely visualized mild hydronephrosis left kidney. Small left kidney stone. 5. Aortic atherosclerosis. Aortic Atherosclerosis (ICD10-I70.0) and Emphysema (ICD10-J43.9). Electronically Signed   By: Jasmine Pang M.D.   On: 12/20/2023 17:42   US Venous Img Lower Bilateral (DVT) Result Date: 12/20/2023 CLINICAL DATA:  Shortness of breath and  swelling EXAM: BILATERAL LOWER EXTREMITY VENOUS DOPPLER ULTRASOUND TECHNIQUE: Gray-scale sonography with graded compression, as well as color Doppler and duplex ultrasound were performed to evaluate the lower extremity deep venous systems from the level of the common femoral vein and including the common femoral, femoral, profunda femoral, popliteal and calf veins including the posterior tibial, peroneal and gastrocnemius veins when visible. The superficial great  saphenous vein was also interrogated. Spectral Doppler was utilized to evaluate flow at rest and with distal augmentation maneuvers in the common femoral, femoral and popliteal veins. COMPARISON:  None Available. FINDINGS: RIGHT LOWER EXTREMITY Common Femoral Vein: No evidence of thrombus. Normal compressibility, respiratory phasicity and response to augmentation. Saphenofemoral Junction: No evidence of thrombus. Normal compressibility and flow on color Doppler imaging. Profunda Femoral Vein: No evidence of thrombus. Normal compressibility and flow on color Doppler imaging. Femoral Vein: No evidence of thrombus. Normal compressibility, respiratory phasicity and response to augmentation. Popliteal Vein: No evidence of thrombus. Normal compressibility, respiratory phasicity and response to augmentation. Calf Veins: No evidence of thrombus. Normal compressibility and flow on color Doppler imaging. Superficial Great Saphenous Vein: No evidence of thrombus. Normal compressibility. Venous Reflux:  None. Other Findings:  None. LEFT LOWER EXTREMITY Common Femoral Vein: No evidence of thrombus. Normal compressibility, respiratory phasicity and response to augmentation. Saphenofemoral Junction: No evidence of thrombus. Normal compressibility and flow on color Doppler imaging. Profunda Femoral Vein: No evidence of thrombus. Normal compressibility and flow on color Doppler imaging. Femoral Vein: No evidence of thrombus. Normal compressibility, respiratory phasicity and response to augmentation. Popliteal Vein: No evidence of thrombus. Normal compressibility, respiratory phasicity and response to augmentation. Calf Veins: No evidence of thrombus. Normal compressibility and flow on color Doppler imaging. Superficial Great Saphenous Vein: No evidence of thrombus. Normal compressibility. Venous Reflux:  None. Other Findings:  Soft tissue edema identified along the ankle. IMPRESSION: No evidence of deep venous thrombosis in either  lower extremity. Electronically Signed   By: Karen Kays M.D.   On: 12/20/2023 16:24   DG Chest Portable 1 View Result Date: 12/20/2023 CLINICAL DATA:  Shortness of breath. EXAM: PORTABLE CHEST 1 VIEW COMPARISON:  None Available. FINDINGS: The heart is enlarged. Aortic atherosclerosis. Bilateral interstitial prominence. No focal consolidation. No sizable pleural effusion or pneumothorax. No acute osseous abnormality. IMPRESSION: Cardiomegaly with findings suggestive of pulmonary vascular congestion. Electronically Signed   By: Hart Robinsons M.D.   On: 12/20/2023 14:55    EKG: Independently reviewed. Atrial fibrillation, rate 154.   Assessment/Plan  1. Atrial fibrillation with RVR - A fib not previously documented; CHADS-VASc is at least 19 (age, gender, HTN, CHF)  - IV heparin and IV diltiazem infusions started in ED  - Continue IV heparin for now, resume metoprolol with dose now and turn down/off IV diltiazem as able, optimize electrolytes, update echocardiogram   2. Acute on chronic HFpEF; acute hypoxic respiratory failure   - Continue diuresis with IV Lasix, update echocardiogram, monitor weight and I/Os, continue beta-blocker as tolerated, continue supplemental O2 as-needed    3. Hypertensive urgency  - BP severely elevated in setting of medication non-adherence and hypervolemia  - Resume Toprol with dose now, continue diuresis    4. COPD  - Not in exacerbation   - Continue LAMA-LABA and as-needed albuterol    DVT prophylaxis: IV heparin  Code Status: Full  Level of Care: Level of care: Telemetry Cardiac Family Communication: None present   Disposition Plan:  Patient is from: Home  Anticipated d/c is to: TBD  Anticipated d/c date is: 12/23/23  Patient currently: Pending improved HR, BP, and volume status, transition to oral medications  Consults called: None  Admission status: Inpatient     Briscoe Deutscher, MD [facility]  12/20/2023, 9:26 PM

## 2023-12-20 NOTE — Progress Notes (Signed)
   12/20/23 2053  Vitals  Temp 99.5 F (37.5 C)  Temp Source Oral  BP (!) 134/109  MAP (mmHg) 116  BP Location Left Arm  BP Method Automatic  Patient Position (if appropriate) Lying  Pulse Rate 97  Pulse Rate Source Monitor  ECG Heart Rate (!) 107  Resp 20  MEWS COLOR  MEWS Score Color Green  Oxygen Therapy  SpO2 92 %  O2 Device Nasal Cannula  O2 Flow Rate (L/min) 2 L/min  MEWS Score  MEWS Temp 0  MEWS Systolic 0  MEWS Pulse 1  MEWS RR 0  MEWS LOC 0  MEWS Score 1   Patient admitted to rm 3E21 from [facility], here for Afib with rvr, pt on heparin and cardizem drip, alert and oriented x 4, oriented to room, call bell placed within reach, pt denies any pain at this time. Hospitalist on call made aware

## 2023-12-20 NOTE — ED Provider Notes (Signed)
[place] EMERGENCY DEPARTMENT AT [facility] Provider Note   CSN: [id number] Arrival date & time: 12/20/23  1208     History  Chief Complaint  Patient presents with   Shortness of Breath    Natalie Alexander is a 67 y.o. female.  Pt is a 67 yo female with pmhx significant for copd and htn.  Pt said she has been sob and  has had swollen legs for a week.  She feels very sob with ambulation.  Pt has not taken her amlodipine, metoprolol, or copd inhaler in 6 months due to losing her insurance.  Pt has been reluctant to come to the hospital because she was worried about the cost.  O2 sat 88% upon arrival.  She was put on 2L.       Home Medications Prior to Admission medications   Medication Sig Start Date End Date Taking? Authorizing Provider  aspirin EC 81 MG tablet Take 81 mg by mouth daily. Swallow whole.    [provider]  Dextromethorphan-guaiFENesin 10-100 MG/5ML liquid Take 10 mLs by mouth every 4 (four) hours as needed. 01/12/22   Atway, Rayann N, DO  metoprolol succinate (TOPROL-XL) 25 MG 24 hr tablet Take 1 tablet by mouth daily. 02/20/22   [provider]  Tiotropium Bromide-Olodaterol (STIOLTO RESPIMAT) 2.5-2.5 MCG/ACT AERS Inhale 2 puffs into the lungs daily. 04/02/22   Martina Sinner, MD      Allergies    Patient has no known allergies.    Review of Systems   Review of Systems  Respiratory:  Positive for cough and shortness of breath.   Cardiovascular:  Positive for leg swelling.  All other systems reviewed and are negative.   Physical Exam Updated Vital Signs BP (!) 142/102   Pulse (!) 117   Temp 97.7 F (36.5 C)   Resp (!) 23   Ht 5\' 9"  (1.753 m)   Wt 74.4 kg   SpO2 94%   BMI 24.22 kg/m  Physical Exam Vitals and nursing note reviewed.  Constitutional:      Appearance: She is well-developed.  HENT:     Head: Normocephalic and atraumatic.     Mouth/Throat:     Mouth: Mucous membranes are moist.     Pharynx:  Oropharynx is clear.  Eyes:     Extraocular Movements: Extraocular movements intact.     Pupils: Pupils are equal, round, and reactive to light.  Cardiovascular:     Rate and Rhythm: Tachycardia present. Rhythm irregular.  Pulmonary:     Breath sounds: Wheezing present.  Abdominal:     General: Bowel sounds are normal.     Palpations: Abdomen is soft.  Musculoskeletal:     Cervical back: Normal range of motion and neck supple.     Right lower leg: Edema present.     Left lower leg: Edema present.  Skin:    General: Skin is warm.     Capillary Refill: Capillary refill takes less than 2 seconds.  Neurological:     General: No focal deficit present.     Mental Status: She is alert and oriented to person, place, and time.  Psychiatric:        Mood and Affect: Mood normal.        Behavior: Behavior normal.     ED Results / Procedures / Treatments   Labs (all labs ordered are listed, but only abnormal results are displayed) Labs Reviewed  CBC WITH DIFFERENTIAL/PLATELET - Abnormal; Notable for the  following components:      Result Value   RBC 5.38 (*)    RDW 15.6 (*)    All other components within normal limits  COMPREHENSIVE METABOLIC PANEL - Abnormal; Notable for the following components:   Glucose, Bld 113 (*)    All other components within normal limits  D-DIMER, QUANTITATIVE - Abnormal; Notable for the following components:   D-Dimer, Quant 0.62 (*)    All other components within normal limits  CBG MONITORING, ED - Abnormal; Notable for the following components:   Glucose-Capillary 130 (*)    All other components within normal limits  TROPONIN I (HIGH SENSITIVITY) - Abnormal; Notable for the following components:   Troponin I (High Sensitivity) 28 (*)    All other components within normal limits  TROPONIN I (HIGH SENSITIVITY) - Abnormal; Notable for the following components:   Troponin I (High Sensitivity) 31 (*)    All other components within normal limits  RESP  PANEL BY RT-PCR (RSV, FLU A&B, COVID)  RVPGX2  TSH  MAGNESIUM  HEPARIN LEVEL (UNFRACTIONATED)  BRAIN NATRIURETIC PEPTIDE    EKG EKG Interpretation Date/Time:  Friday December 20 2023 12:29:47 EST Ventricular Rate:  154 PR Interval:    QRS Duration:  106 QT Interval:  278 QTC Calculation: 445 R Axis:   -12  Text Interpretation: Atrial fibrillation with rapid ventricular response Minimal voltage criteria for LVH, may be normal variant ( Cornell product ) Cannot rule out Anterior infarct , age undetermined Marked ST abnormality, possible lateral subendocardial injury Abnormal ECG When compared with ECG of 10-Jan-2022 22:32, Significant changes have occurred new afib with rvr Confirmed by Jacalyn Lefevre [phone]) on 12/20/2023 1:34:10 PM  Radiology DG Chest Portable 1 View Result Date: 12/20/2023 CLINICAL DATA:  Shortness of breath. EXAM: PORTABLE CHEST 1 VIEW COMPARISON:  None Available. FINDINGS: The heart is enlarged. Aortic atherosclerosis. Bilateral interstitial prominence. No focal consolidation. No sizable pleural effusion or pneumothorax. No acute osseous abnormality. IMPRESSION: Cardiomegaly with findings suggestive of pulmonary vascular congestion. Electronically Signed   By: Hart Robinsons M.D.   On: 12/20/2023 14:55    Procedures Procedures    Medications Ordered in ED Medications  albuterol (VENTOLIN HFA) 108 (90 Base) MCG/ACT inhaler 2 puff (has no administration in time range)  diltiazem (CARDIZEM) 1 mg/mL load via infusion 20 mg (20 mg Intravenous Bolus from Bag 12/20/23 1311)    And  diltiazem (CARDIZEM) 125 mg in dextrose 5% 125 mL (1 mg/mL) infusion (12.5 mg/hr Intravenous Rate/Dose Change 12/20/23 1445)  heparin ADULT infusion 100 units/mL (25000 units/278mL) (1,000 Units/hr Intravenous New Bag/Given 12/20/23 1331)  iohexol (OMNIPAQUE) 350 MG/ML injection 75 mL (has no administration in time range)  heparin bolus via infusion 4,500 Units (4,500 Units Intravenous  Bolus from Bag 12/20/23 1331)  furosemide (LASIX) injection 40 mg (40 mg Intravenous Given 12/20/23 1527)  metoprolol tartrate (LOPRESSOR) injection 5 mg (5 mg Intravenous Given 12/20/23 1527)    ED Course/ Medical Decision Making/ A&P                                 Medical Decision Making Amount and/or Complexity of Data Reviewed Labs: ordered. Radiology: ordered.  Risk Prescription drug management. Decision regarding hospitalization.   This patient presents to the ED for concern of sob, this involves an extensive number of treatment options, and is a complaint that carries with it a high risk of complications and morbidity.  The differential diagnosis includes copd exac, covid/flu/rsv, pna, cardiac   Co morbidities that complicate the patient evaluation  copd and htn   Additional history obtained:  Additional history obtained from epic chart review External records from outside source obtained and reviewed including family   Lab Tests:  I Ordered, and personally interpreted labs.  The pertinent results include:  cbc nl, ddimer 0.62, covid/flu/rsv neg, cmp nl, trop 28   Imaging Studies ordered:  I ordered imaging studies including cxr, [place], and cta chest  I independently visualized and interpreted imaging which showed  CXR: Cardiomegaly with findings suggestive of pulmonary vascular  congestion.  [place] and CT pending at admission. I agree with the radiologist interpretation   Cardiac Monitoring:  The patient was maintained on a cardiac monitor.  I personally viewed and interpreted the cardiac monitored which showed an underlying rhythm of: afib with rvr   Medicines ordered and prescription drug management:  I ordered medication including cardizem bolus and infusion  for sx  Reevaluation of the patient after these medicines showed that the patient improved I have reviewed the patients home medicines and have made adjustments as needed   Test  Considered:  ct   Critical Interventions:  Oxygen, cardizem   Consultations Obtained:  I requested consultation with the hospitalist (Dr. Arlyss Queen),  and discussed lab and imaging findings as well as pertinent plan - he will admit.   Problem List / ED Course:  Afib with rvr:  new onset.  Cardizem bolus and drip started.  Heparin started for now.  Chadvasc 1 for htn. CHF on CXR:  likely from uncontrolled HR   Reevaluation:  After the interventions noted above, I reevaluated the patient and found that they have :improved   Social Determinants of Health:  Lives at home   Dispostion:  After consideration of the diagnostic results and the patients response to treatment, I feel that the patent would benefit from admission.    CRITICAL CARE Performed by: Jacalyn Lefevre   Total critical care time: 30 minutes  Critical care time was exclusive of separately billable procedures and treating other patients.  Critical care was necessary to treat or prevent imminent or life-threatening deterioration.  Critical care was time spent personally by me on the following activities: development of treatment plan with patient and/or surrogate as well as nursing, discussions with consultants, evaluation of patient's response to treatment, examination of patient, obtaining history from patient or surrogate, ordering and performing treatments and interventions, ordering and review of laboratory studies, ordering and review of radiographic studies, pulse oximetry and re-evaluation of patient's condition.         Final Clinical Impression(s) / ED Diagnoses Final diagnoses:  Atrial fibrillation with RVR (HCC)  Acute congestive heart failure, unspecified heart failure type [facility])    Rx / DC Orders ED Discharge Orders     None         Jacalyn Lefevre, MD 12/20/23 1550

## 2023-12-20 NOTE — Progress Notes (Signed)
PHARMACY - ANTICOAGULATION CONSULT NOTE  Pharmacy Consult for heparin Indication: atrial fibrillation  No Known Allergies  Patient Measurements: Height: 5\' 9"  (175.3 cm) Weight: 74.4 kg (164 lb 0.4 oz) IBW/kg (Calculated) : 66.2 Heparin Dosing Weight: 74.4kg  Vital Signs: Temp: 99.5 F (37.5 C) (12/20 2053) Temp Source: Oral (12/20 2053) BP: 134/109 (12/20 2053) Pulse Rate: 97 (12/20 2053)  Labs: Recent Labs    12/20/23 1302 12/20/23 1450 12/20/23 1934  HGB 14.5  --   --   HCT 46.0  --   --   PLT 191  --   --   HEPARINUNFRC  --   --  0.34  CREATININE 0.73  --   --   TROPONINIHS 28* 31*  --     Estimated Creatinine Clearance: 70.3 mL/min (by C-G formula based on SCr of 0.73 mg/dL).   Medical History: Past Medical History:  Diagnosis Date   COPD (chronic obstructive pulmonary disease) (HCC)     Medications:  Infusions:   diltiazem (CARDIZEM) infusion 15 mg/hr (12/20/23 1928)   heparin 1,000 Units/hr (12/20/23 1331)   magnesium sulfate bolus IVPB      Assessment: 67 yof presented to the ED with ShOB and swollen legs. To start IV heparin for afib. Baseline Hgb and platelets are WNL. She is not on anticoagulation PTA.   Heparin level 0.34 (therapeutic) on infusion at 1000 units/hr. No bleeding noted.  Goal of Therapy:  Heparin level 0.3-0.7 units/ml Monitor platelets by anticoagulation protocol: Yes   Plan:  Continue heparin gtt 1000 units/hr Daily heparin level and CBC   Christoper Fabian, PharmD, BCPS Please see amion for complete clinical pharmacist phone list 12/20/2023,10:36 PM

## 2023-12-21 ENCOUNTER — Inpatient Hospital Stay (HOSPITAL_COMMUNITY): Payer: Self-pay

## 2023-12-21 DIAGNOSIS — I4891 Unspecified atrial fibrillation: Secondary | ICD-10-CM

## 2023-12-21 DIAGNOSIS — I5031 Acute diastolic (congestive) heart failure: Secondary | ICD-10-CM

## 2023-12-21 LAB — BASIC METABOLIC PANEL
Anion gap: 8 (ref 5–15)
BUN: 11 mg/dL (ref 8–23)
CO2: 29 mmol/L (ref 22–32)
Calcium: 9 mg/dL (ref 8.9–10.3)
Chloride: 104 mmol/L (ref 98–111)
Creatinine, Ser: 1.05 mg/dL — ABNORMAL HIGH (ref 0.44–1.00)
GFR, Estimated: 58 mL/min — ABNORMAL LOW (ref >60)
Glucose, Bld: 134 mg/dL — ABNORMAL HIGH (ref 70–99)
Potassium: 3.3 mmol/L — ABNORMAL LOW (ref 3.5–5.1)
Sodium: 141 mmol/L (ref 135–145)

## 2023-12-21 LAB — CBC
HCT: 42.6 % (ref 36.0–46.0)
Hemoglobin: 13.3 g/dL (ref 12.0–15.0)
MCH: 27.1 pg (ref 26.0–34.0)
MCHC: 31.2 g/dL (ref 30.0–36.0)
MCV: 86.9 fL (ref 80.0–100.0)
Platelets: 190 10*3/uL (ref 150–400)
RBC: 4.9 MIL/uL (ref 3.87–5.11)
RDW: 15.6 % — ABNORMAL HIGH (ref 11.5–15.5)
WBC: 6.5 10*3/uL (ref 4.0–10.5)
nRBC: 0 % (ref 0.0–0.2)

## 2023-12-21 LAB — ECHOCARDIOGRAM COMPLETE
AR max vel: 0.83 cm2
AV Area VTI: 0.77 cm2
AV Area mean vel: 0.8 cm2
AV Mean grad: 8 mm[Hg]
AV Peak grad: 14.3 mm[Hg]
Ao pk vel: 1.89 m/s
Area-P 1/2: 6.54 cm2
Height: 69 in
MV M vel: 3.71 m/s
MV Peak grad: 55.1 mm[Hg]
S' Lateral: 3.5 cm
Weight: 2624.36 [oz_av]

## 2023-12-21 LAB — HEPARIN LEVEL (UNFRACTIONATED): Heparin Unfractionated: 0.13 [IU]/mL — ABNORMAL LOW (ref 0.30–0.70)

## 2023-12-21 LAB — MAGNESIUM: Magnesium: 2.3 mg/dL (ref 1.7–2.4)

## 2023-12-21 LAB — HIV ANTIBODY (ROUTINE TESTING W REFLEX): HIV Screen 4th Generation wRfx: NONREACTIVE

## 2023-12-21 MED ORDER — METOPROLOL TARTRATE 5 MG/5ML IV SOLN
5.0000 mg | Freq: Four times a day (QID) | INTRAVENOUS | Status: DC | PRN
Start: 2023-12-21 — End: 2023-12-26
  Administered 2023-12-22 – 2023-12-23 (×2): 5 mg via INTRAVENOUS
  Filled 2023-12-21 (×2): qty 5

## 2023-12-21 MED ORDER — METOPROLOL TARTRATE 50 MG PO TABS
50.0000 mg | ORAL_TABLET | Freq: Two times a day (BID) | ORAL | Status: DC
  Administered 2023-12-21: 50 mg via ORAL
  Filled 2023-12-21: qty 1

## 2023-12-21 MED ORDER — POTASSIUM CHLORIDE CRYS ER 20 MEQ PO TBCR
40.0000 meq | EXTENDED_RELEASE_TABLET | ORAL | Status: AC
  Administered 2023-12-21 (×2): 40 meq via ORAL
  Filled 2023-12-21 (×2): qty 2

## 2023-12-21 MED ORDER — HEPARIN BOLUS VIA INFUSION
2200.0000 [IU] | Freq: Once | INTRAVENOUS | Status: AC
  Administered 2023-12-21: 2200 [IU] via INTRAVENOUS
  Filled 2023-12-21: qty 2200

## 2023-12-21 MED ORDER — ENOXAPARIN SODIUM 120 MG/0.8ML IJ SOSY
110.0000 mg | PREFILLED_SYRINGE | INTRAMUSCULAR | Status: DC
  Administered 2023-12-21: 110 mg via SUBCUTANEOUS
  Filled 2023-12-21 (×2): qty 0.74

## 2023-12-21 NOTE — Progress Notes (Signed)
Echocardiogram 2D Echocardiogram has been performed.  Natalie Alexander 12/21/2023, 3:31 PM

## 2023-12-21 NOTE — Progress Notes (Signed)
PHARMACY - ANTICOAGULATION CONSULT NOTE  Pharmacy Consult for heparin>lovenox  Indication: atrial fibrillation  No Known Allergies  Patient Measurements: Height: 5\' 9"  (175.3 cm) Weight: 74.4 kg (164 lb 0.4 oz) IBW/kg (Calculated) : 66.2 Heparin Dosing Weight: 74.4kg  Vital Signs: Temp: 97.8 F (36.6 C) (12/21 1340) Temp Source: Oral (12/21 1340) BP: 97/64 (12/21 1340) Pulse Rate: 67 (12/21 1340)  Labs: Recent Labs    12/20/23 1302 12/20/23 1450 12/20/23 1934 12/21/23 0637  HGB 14.5  --   --  13.3  HCT 46.0  --   --  42.6  PLT 191  --   --  190  HEPARINUNFRC  --   --  0.34 0.13*  CREATININE 0.73  --   --  1.05*  TROPONINIHS 28* 31*  --   --     Estimated Creatinine Clearance: 53.6 mL/min (A) (by C-G formula based on SCr of 1.05 mg/dL (H)).   Medical History: Past Medical History:  Diagnosis Date   COPD (chronic obstructive pulmonary disease) (HCC)     Medications:  Infusions:   diltiazem (CARDIZEM) infusion 12.5 mg/hr (12/21/23 1451)   heparin 1,250 Units/hr (12/21/23 0827)    Assessment: 37 yof presented to the ED with ShOB and swollen legs, in Afib with RVR. To start IV heparin for afib. Baseline Hgb and platelets are WNL. She is not on anticoagulation PTA.  Pharmacy consulted for transition from heparin to lovenox. Will dose 1.5 mg/kg Q24H as she is not obese.    Goal of Therapy:  Heparin level 0.3-0.7 units/ml Monitor platelets by anticoagulation protocol: Yes  Plan:  STOP heparin  START lovenox 110 mg Q24H- administer first dose at the same time heparin infusion is stopped  Monitor CBC, signs/symptoms of bleeding F/u plan to transition to oral anticoagulation  Jani Gravel, PharmD Clinical Pharmacist  12/21/2023 4:05 PM

## 2023-12-21 NOTE — Progress Notes (Signed)
PROGRESS NOTE    Natalie Alexander  WNU:[id number] DOB: 15-Jun-1956 DOA: 12/20/2023 PCP: Patient, No Pcp Per    Brief Narrative:   Natalie Alexander is a 67 y.o. female with past medical history significant for HTN, COPD who presented to [facility] ED on 12/20 from home with progressive shortness of breath and lower extremity edema over the last week.  She reports that lost her insurance and has not taken her medications in at least 6 months (amlodipine, metoprolol, Stiolto).  Reports worse with exertion.  Denies cough, no fever/chills, no chest pain, abdominal pain.  In the ED, temperature 97.7 F, HR 162, RR 22, BP 180/104, SpO2 91% on room air and placed on 2 L nasal cannula.  WBC 7.6, hemoglobin 14.5, platelet count 191.  Sodium 140, potassium 3.7, chloride 103, CO2 26, glucose 113, BUN 12, creat 0.73.  AST 30, ALT 37, total bilirubin 0.9.  High sensitive troponin 28 followed by 31.  BNP 641.9.  TSH 1.305.  COVID/RSV/influenza PCR negative.  Chest x-ray with cardiomegaly with findings suggestive of pulmonary vascular congestion.  CT angiogram chest negative for pulmonary embolism, cardiomegaly, dilated pulmonary trunk suggesting pulmonary HTN, incompletely visualized mild hydronephrosis left kidney, small left kidney stone, aortic atherosclerosis.  Patient was given 20 g IV diltiazem, started on diltiazem infusion, IV heparin drip, given 5 mg IV Lopressor and 40 mg IV Lasix.  TRH was consulted and patient was transferred to [facility] hospital for further evaluation and management of new A-fib with RVR, CHF exacerbation.  Assessment & Plan:   Acute hypoxic respiratory failure, POA Acute diastolic congestive heart failure exacerbation Patient presenting to ED with progressive shortness of breath associated lower extremity edema.  Patient is afebrile without leukocytosis.  BNP elevated 641.9 with chest x-ray findings of cardiomegaly with pulmonary vascular congestion.  CT angiogram chest  negative for pulmonary embolism.  Duplex ultrasound bilateral lower extremities negative for DVT.  Has been noncompliant with her home antihypertensives due to insurance issues.  Previous TTE 01/08/2022 with LVEF 60 to 65%, grade 2 diastolic dysfunction, PASP 38.0 mmHg, IVC dilated. -- TTE: Pending -- Furosemide 40 mg IV every 12 hours -- Strict I's and O's and daily weights -- BMP daily  Atrial fibrillation with RVR, new diagnosis On arrival to the ED, patient was noted to be tachycardic on telemetry with EKG consistent with atrial fibrillation with RVR, rates up to 162.  TSH within normal limits.  No previous diagnosis of arrhythmia. -- Metoprolol succinate 25 mg p.o. daily -- Cardizem drip -- Heparin drip  Hypertensive urgency Hx essential hypertension Blood pressure elevated 180/104 admission, has been out of her home antihypertensives to include metoprolol and amlodipine for 6 months. -- Metoprolol succinate 25 mg p.o. daily -- On Cardizem drip as above  Hypokalemia Potassium 3.3, will replete. -- Repeat electrolytes in a.m.  COPD Follows with pulmonology outpatient, Dr. Francine Graven.  Has been out of her inhaler, Stioloto for 6 months due to insurance issues. -- Recommended neb twice daily -- Incruse Ellipta 1 puff daily -- Albuterol neb every 4 hours.  Wheezing/shortness of breath  History of tobacco use disorder Patient reports abstinence for 2 years.  Continue to encourage to remain abstinent.  Medical noncompliance Patient reports has lost her insurance and has been out of her home medications for 6 months. --TOC consultation    DVT prophylaxis: Heparin drip    Code Status: Full Code Family Communication: No family present at bedside  Disposition Plan:  Level of  care: Telemetry Cardiac Status is: Inpatient Remains inpatient appropriate because: IV heparin/Cardizem drip, pending echocardiogram, needs transition to oral medications with stability of heart rate and blood  pressure before stable for discharge home, anticipate medication needs, pending TOC consultation    Consultants:  None  Procedures:  TTE: Pending  Antimicrobials:  None   Subjective: Patient seen examined bedside, resting calmly.  Lying in bed.  Reports dyspnea and lower extremity edema improved.  Remains on Cardizem drip and heparin drip.  Awaiting echocardiogram.  Reports that she lives with her daughter, had issues with insurance.  No other specific questions, concerns or complaints at this time.  Denies headache, no visual changes, no chest pain, no palpitations, no nausea/vomiting/diarrhea, no cough/congestion, no abdominal pain, no focal weakness, no fatigue, no paresthesias.  No acute events overnight per nursing staff.  Objective: Vitals:   12/21/23 0059 12/21/23 0524 12/21/23 0727 12/21/23 0952  BP: (!) 141/67 124/84 (!) 114/53   Pulse: (!) 119 83 72   Resp: 20 20 19    Temp: 98.3 F (36.8 C) 98.1 F (36.7 C) 98.3 F (36.8 C)   TempSrc: Oral Oral Oral   SpO2: 94% 94% 95% 95%  Weight:      Height:        Intake/Output Summary (Last 24 hours) at 12/21/2023 0953 Last data filed at 12/21/2023 0834 Gross per 24 hour  Intake 420.06 ml  Output 500 ml  Net -79.94 ml   Filed Weights   12/20/23 1237  Weight: 74.4 kg    Examination:  Physical Exam: GEN: NAD, alert and oriented x 3, wd/wn HEENT: NCAT, PERRL, EOMI, sclera clear, MMM PULM: Mild crackles bilateral bases without wheezing, normal respiratory effort without accessory muscle use, on 2 L nasal cannula with SpO2 94% at rest CV: IIR, normal rate w/o M/G/R GI: abd soft, NTND, NABS, no R/G/M MSK: Trace bilateral lower extremity peripheral edema, muscle strength globally intact 5/5 bilateral upper/lower extremities NEURO: CN II-XII intact, no focal deficits, sensation to light touch intact PSYCH: normal mood/affect Integumentary: dry/intact, no rashes or wounds    Data Reviewed: I have personally reviewed  following labs and imaging studies  CBC: Recent Labs  Lab 12/20/23 1302 12/21/23 0637  WBC 7.6 6.5  NEUTROABS 6.1  --   HGB 14.5 13.3  HCT 46.0 42.6  MCV 85.5 86.9  PLT 191 190   Basic Metabolic Panel: Recent Labs  Lab 12/20/23 1302 12/21/23 0637  NA 140 141  K 3.7 3.3*  CL 103 104  CO2 26 29  GLUCOSE 113* 134*  BUN 12 11  CREATININE 0.73 1.05*  CALCIUM 9.2 9.0  MG 1.9 2.3   GFR: Estimated Creatinine Clearance: 53.6 mL/min (A) (by C-G formula based on SCr of 1.05 mg/dL (H)). Liver Function Tests: Recent Labs  Lab 12/20/23 1302  AST 30  ALT 37  ALKPHOS 93  BILITOT 0.9  PROT 7.7  ALBUMIN 4.3   No results for input(s): "LIPASE", "AMYLASE" in the last 168 hours. No results for input(s): "AMMONIA" in the last 168 hours. Coagulation Profile: No results for input(s): "INR", "PROTIME" in the last 168 hours. Cardiac Enzymes: No results for input(s): "CKTOTAL", "CKMB", "CKMBINDEX", "TROPONINI" in the last 168 hours. BNP (last 3 results) No results for input(s): "PROBNP" in the last 8760 hours. HbA1C: No results for input(s): "HGBA1C" in the last 72 hours. CBG: Recent Labs  Lab 12/20/23 1223  GLUCAP 130*   Lipid Profile: No results for input(s): "CHOL", "HDL", "LDLCALC", "TRIG", "  CHOLHDL", "LDLDIRECT" in the last 72 hours. Thyroid Function Tests: Recent Labs    12/20/23 1302  TSH 1.305   Anemia Panel: No results for input(s): "VITAMINB12", "FOLATE", "FERRITIN", "TIBC", "IRON", "RETICCTPCT" in the last 72 hours. Sepsis Labs: No results for input(s): "PROCALCITON", "LATICACIDVEN" in the last 168 hours.  Recent Results (from the past 240 hours)  Resp panel by RT-PCR (RSV, Flu A&B, Covid) Anterior Nasal Swab     Status: None   Collection Time: 12/20/23  1:02 PM   Specimen: Anterior Nasal Swab  Result Value Ref Range Status   SARS Coronavirus 2 by RT PCR NEGATIVE NEGATIVE Final    Comment: (NOTE) SARS-CoV-2 target nucleic acids are NOT DETECTED.  The  SARS-CoV-2 RNA is generally detectable in upper respiratory specimens during the acute phase of infection. The lowest concentration of SARS-CoV-2 viral copies this assay can detect is 138 copies/mL. A negative result does not preclude SARS-Cov-2 infection and should not be used as the sole basis for treatment or other patient management decisions. A negative result may occur with  improper specimen collection/handling, submission of specimen other than nasopharyngeal swab, presence of viral mutation(s) within the areas targeted by this assay, and inadequate number of viral copies(<138 copies/mL). A negative result must be combined with clinical observations, patient history, and epidemiological information. The expected result is Negative.  Fact Sheet for Patients:  BloggerCourse.com  Fact Sheet for Healthcare Providers:  SeriousBroker.it  This test is no t yet approved or cleared by the [place] FDA and  has been authorized for detection and/or diagnosis of SARS-CoV-2 by FDA under an Emergency Use Authorization (EUA). This EUA will remain  in effect (meaning this test can be used) for the duration of the COVID-19 declaration under Section 564(b)(1) of the Act, 21 U.S.C.section 360bbb-3(b)(1), unless the authorization is terminated  or revoked sooner.       Influenza A by PCR NEGATIVE NEGATIVE Final   Influenza B by PCR NEGATIVE NEGATIVE Final    Comment: (NOTE) The Xpert Xpress SARS-CoV-2/FLU/RSV plus assay is intended as an aid in the diagnosis of influenza from Nasopharyngeal swab specimens and should not be used as a sole basis for treatment. Nasal washings and aspirates are unacceptable for Xpert Xpress SARS-CoV-2/FLU/RSV testing.  Fact Sheet for Patients: BloggerCourse.com  Fact Sheet for Healthcare Providers: SeriousBroker.it  This test is not yet approved or  cleared by the [place] FDA and has been authorized for detection and/or diagnosis of SARS-CoV-2 by FDA under an Emergency Use Authorization (EUA). This EUA will remain in effect (meaning this test can be used) for the duration of the COVID-19 declaration under Section 564(b)(1) of the Act, 21 U.S.C. section 360bbb-3(b)(1), unless the authorization is terminated or revoked.     Resp Syncytial Virus by PCR NEGATIVE NEGATIVE Final    Comment: (NOTE) Fact Sheet for Patients: BloggerCourse.com  Fact Sheet for Healthcare Providers: SeriousBroker.it  This test is not yet approved or cleared by the [place] FDA and has been authorized for detection and/or diagnosis of SARS-CoV-2 by FDA under an Emergency Use Authorization (EUA). This EUA will remain in effect (meaning this test can be used) for the duration of the COVID-19 declaration under Section 564(b)(1) of the Act, 21 U.S.C. section 360bbb-3(b)(1), unless the authorization is terminated or revoked.  Performed at [facility], [address] [place], [place] [address]          Radiology Studies: CT Angio Chest PE W and/or Wo Contrast Result Date:  12/20/2023 CLINICAL DATA:  Shortness of breath EXAM: CT ANGIOGRAPHY CHEST WITH CONTRAST TECHNIQUE: Multidetector CT imaging of the chest was performed using the standard protocol during bolus administration of intravenous contrast. Multiplanar CT image reconstructions and MIPs were obtained to evaluate the vascular anatomy. RADIATION DOSE REDUCTION: This exam was performed according to the departmental dose-optimization program which includes automated exposure control, adjustment of the mA and/or kV according to patient size and/or use of iterative reconstruction technique. CONTRAST:  75mL OMNIPAQUE IOHEXOL 350 MG/ML SOLN COMPARISON:  12/20/2023, 01/08/2022 chest CT FINDINGS: Cardiovascular: Satisfactory  opacification of the pulmonary arteries to the segmental level. No evidence of pulmonary embolism. Moderate aortic atherosclerosis. No aneurysm. Dilated pulmonary trunk up to 3.9 cm. Coronary vascular calcification. Cardiomegaly. No pericardial effusion. Mediastinum/Nodes: Patent trachea. No thyroid mass. Mild mediastinal lymph nodes. Right paratracheal nodes measuring up to 16 mm. This is unchanged compared with 2023. Esophagus within normal limits. Small right hilar nodes measuring up to 12 mm, also unchanged. Lungs/Pleura: Emphysema. No acute airspace disease, pleural effusion, or pneumothorax. Upper Abdomen: No acute finding. Incompletely visualized mild hydronephrosis left kidney. Small left kidney stone. Musculoskeletal: No acute or suspicious osseous abnormality. Review of the MIP images confirms the above findings. IMPRESSION: 1. Negative for acute pulmonary embolus. 2. Emphysema. No CT evidence for acute pulmonary abnormality. 3. Cardiomegaly. Dilated pulmonary trunk suggesting pulmonary arterial hypertension. 4. Incompletely visualized mild hydronephrosis left kidney. Small left kidney stone. 5. Aortic atherosclerosis. Aortic Atherosclerosis (ICD10-I70.0) and Emphysema (ICD10-J43.9). Electronically Signed   By: Jasmine Pang M.D.   On: 12/20/2023 17:42   US Venous Img Lower Bilateral (DVT) Result Date: 12/20/2023 CLINICAL DATA:  Shortness of breath and swelling EXAM: BILATERAL LOWER EXTREMITY VENOUS DOPPLER ULTRASOUND TECHNIQUE: Gray-scale sonography with graded compression, as well as color Doppler and duplex ultrasound were performed to evaluate the lower extremity deep venous systems from the level of the common femoral vein and including the common femoral, femoral, profunda femoral, popliteal and calf veins including the posterior tibial, peroneal and gastrocnemius veins when visible. The superficial great saphenous vein was also interrogated. Spectral Doppler was utilized to evaluate flow at rest  and with distal augmentation maneuvers in the common femoral, femoral and popliteal veins. COMPARISON:  None Available. FINDINGS: RIGHT LOWER EXTREMITY Common Femoral Vein: No evidence of thrombus. Normal compressibility, respiratory phasicity and response to augmentation. Saphenofemoral Junction: No evidence of thrombus. Normal compressibility and flow on color Doppler imaging. Profunda Femoral Vein: No evidence of thrombus. Normal compressibility and flow on color Doppler imaging. Femoral Vein: No evidence of thrombus. Normal compressibility, respiratory phasicity and response to augmentation. Popliteal Vein: No evidence of thrombus. Normal compressibility, respiratory phasicity and response to augmentation. Calf Veins: No evidence of thrombus. Normal compressibility and flow on color Doppler imaging. Superficial Great Saphenous Vein: No evidence of thrombus. Normal compressibility. Venous Reflux:  None. Other Findings:  None. LEFT LOWER EXTREMITY Common Femoral Vein: No evidence of thrombus. Normal compressibility, respiratory phasicity and response to augmentation. Saphenofemoral Junction: No evidence of thrombus. Normal compressibility and flow on color Doppler imaging. Profunda Femoral Vein: No evidence of thrombus. Normal compressibility and flow on color Doppler imaging. Femoral Vein: No evidence of thrombus. Normal compressibility, respiratory phasicity and response to augmentation. Popliteal Vein: No evidence of thrombus. Normal compressibility, respiratory phasicity and response to augmentation. Calf Veins: No evidence of thrombus. Normal compressibility and flow on color Doppler imaging. Superficial Great Saphenous Vein: No evidence of thrombus. Normal compressibility. Venous Reflux:  None. Other Findings:  Soft tissue  edema identified along the ankle. IMPRESSION: No evidence of deep venous thrombosis in either lower extremity. Electronically Signed   By: Karen Kays M.D.   On: 12/20/2023 16:24   DG  Chest Portable 1 View Result Date: 12/20/2023 CLINICAL DATA:  Shortness of breath. EXAM: PORTABLE CHEST 1 VIEW COMPARISON:  None Available. FINDINGS: The heart is enlarged. Aortic atherosclerosis. Bilateral interstitial prominence. No focal consolidation. No sizable pleural effusion or pneumothorax. No acute osseous abnormality. IMPRESSION: Cardiomegaly with findings suggestive of pulmonary vascular congestion. Electronically Signed   By: Hart Robinsons M.D.   On: 12/20/2023 14:55        Scheduled Meds:  arformoterol  15 mcg Nebulization BID   And   umeclidinium bromide  1 puff Inhalation Daily   furosemide  40 mg Intravenous Q12H   metoprolol succinate  25 mg Oral Daily   potassium chloride  40 mEq Oral Q3H   sodium chloride flush  3 mL Intravenous Q12H   Continuous Infusions:  diltiazem (CARDIZEM) infusion 15 mg/hr (12/21/23 0428)   heparin 1,250 Units/hr (12/21/23 0827)     LOS: 1 day    Time spent: 52 minutes spent on chart review, discussion with nursing staff, consultants, updating family and interview/physical exam; more than 50% of that time was spent in counseling and/or coordination of care.    Alvira Philips [place], DO [facility] Available via Epic secure chat 7am-7pm After these hours, please refer to coverage provider listed on [email or web address] 12/21/2023, 9:53 AM

## 2023-12-21 NOTE — Progress Notes (Signed)
PHARMACY - ANTICOAGULATION CONSULT NOTE  Pharmacy Consult for heparin Indication: atrial fibrillation  No Known Allergies  Patient Measurements: Height: 5\' 9"  (175.3 cm) Weight: 74.4 kg (164 lb 0.4 oz) IBW/kg (Calculated) : 66.2 Heparin Dosing Weight: 74.4kg  Vital Signs: Temp: 98.3 F (36.8 C) (12/21 0727) Temp Source: Oral (12/21 0727) BP: 114/53 (12/21 0727) Pulse Rate: 72 (12/21 0727)  Labs: Recent Labs    12/20/23 1302 12/20/23 1450 12/20/23 1934 12/21/23 0637  HGB 14.5  --   --  13.3  HCT 46.0  --   --  42.6  PLT 191  --   --  190  HEPARINUNFRC  --   --  0.34 0.13*  CREATININE 0.73  --   --  1.05*  TROPONINIHS 28* 31*  --   --     Estimated Creatinine Clearance: 53.6 mL/min (A) (by C-G formula based on SCr of 1.05 mg/dL (H)).   Medical History: Past Medical History:  Diagnosis Date   COPD (chronic obstructive pulmonary disease) (HCC)     Medications:  Infusions:   diltiazem (CARDIZEM) infusion 15 mg/hr (12/21/23 0428)   heparin 1,000 Units/hr (12/20/23 2325)    Assessment: 31 yof presented to the ED with ShOB and swollen legs, in Afib with RVR. To start IV heparin for afib. Baseline Hgb and platelets are WNL. She is not on anticoagulation PTA. Pharmacy consulted for heparin infusion management.  Heparin level 0.13 is subtherapeutic with heparin running at 1000 units/hr. Hgb (13.3) and PLTs (190) are stable. Per RN, no report of pauses, issues with the line, or signs of bleeding. Confirmed at bedside that the heparin level was drawn on the arm opposite of the heparin infusion.   Goal of Therapy:  Heparin level 0.3-0.7 units/ml Monitor platelets by anticoagulation protocol: Yes  Plan:  Give heparin bolus 2200 units x1 Increase heparin gtt to 1250 units/hr Check 6 hour heparin level Monitor daily heparin level, CBC, and signs/symptoms of bleeding F/u plan to transition to oral anticoagulation   Romie Minus, PharmD PGY1 Pharmacy  Resident  Please check AMION for all [facility] Pharmacy phone numbers After 10:00 PM, call Main Pharmacy [phone] 12/21/2023,7:40 AM

## 2023-12-21 NOTE — Plan of Care (Signed)
Pt progressing on all care plans.

## 2023-12-22 ENCOUNTER — Encounter (HOSPITAL_COMMUNITY): Payer: Self-pay | Admitting: Family Medicine

## 2023-12-22 DIAGNOSIS — I4891 Unspecified atrial fibrillation: Secondary | ICD-10-CM | POA: Diagnosis not present

## 2023-12-22 LAB — CBC
HCT: 43 % (ref 36.0–46.0)
Hemoglobin: 13.4 g/dL (ref 12.0–15.0)
MCH: 27 pg (ref 26.0–34.0)
MCHC: 31.2 g/dL (ref 30.0–36.0)
MCV: 86.7 fL (ref 80.0–100.0)
Platelets: 199 10*3/uL (ref 150–400)
RBC: 4.96 MIL/uL (ref 3.87–5.11)
RDW: 15.9 % — ABNORMAL HIGH (ref 11.5–15.5)
WBC: 5.5 10*3/uL (ref 4.0–10.5)
nRBC: 0 % (ref 0.0–0.2)

## 2023-12-22 LAB — HEMOGLOBIN A1C
Hgb A1c MFr Bld: 6.7 % — ABNORMAL HIGH (ref 4.8–5.6)
Mean Plasma Glucose: 145.59 mg/dL

## 2023-12-22 LAB — BASIC METABOLIC PANEL
Anion gap: 9 (ref 5–15)
BUN: 16 mg/dL (ref 8–23)
CO2: 26 mmol/L (ref 22–32)
Calcium: 9.3 mg/dL (ref 8.9–10.3)
Chloride: 104 mmol/L (ref 98–111)
Creatinine, Ser: 1.03 mg/dL — ABNORMAL HIGH (ref 0.44–1.00)
GFR, Estimated: 59 mL/min — ABNORMAL LOW (ref >60)
Glucose, Bld: 122 mg/dL — ABNORMAL HIGH (ref 70–99)
Potassium: 3.8 mmol/L (ref 3.5–5.1)
Sodium: 139 mmol/L (ref 135–145)

## 2023-12-22 LAB — LIPID PANEL
Cholesterol: 177 mg/dL (ref 0–200)
HDL: 38 mg/dL — ABNORMAL LOW (ref >40)
LDL Cholesterol: 120 mg/dL — ABNORMAL HIGH (ref 0–99)
Total CHOL/HDL Ratio: 4.7 {ratio}
Triglycerides: 93 mg/dL (ref <150)
VLDL: 19 mg/dL (ref 0–40)

## 2023-12-22 LAB — MAGNESIUM: Magnesium: 2.2 mg/dL (ref 1.7–2.4)

## 2023-12-22 MED ORDER — ATORVASTATIN CALCIUM 40 MG PO TABS
40.0000 mg | ORAL_TABLET | Freq: Every day | ORAL | Status: DC
  Administered 2023-12-22 – 2023-12-26 (×5): 40 mg via ORAL
  Filled 2023-12-22 (×5): qty 1

## 2023-12-22 MED ORDER — APIXABAN 5 MG PO TABS
5.0000 mg | ORAL_TABLET | Freq: Two times a day (BID) | ORAL | Status: DC
  Administered 2023-12-22 – 2023-12-26 (×8): 5 mg via ORAL
  Filled 2023-12-22 (×8): qty 1

## 2023-12-22 MED ORDER — METOPROLOL TARTRATE 50 MG PO TABS
75.0000 mg | ORAL_TABLET | Freq: Two times a day (BID) | ORAL | Status: DC
Start: 2023-12-22 — End: 2023-12-24
  Administered 2023-12-22 – 2023-12-24 (×5): 75 mg via ORAL
  Filled 2023-12-22 (×5): qty 1

## 2023-12-22 MED ORDER — POTASSIUM CHLORIDE CRYS ER 20 MEQ PO TBCR
20.0000 meq | EXTENDED_RELEASE_TABLET | Freq: Once | ORAL | Status: AC
  Administered 2023-12-22: 20 meq via ORAL
  Filled 2023-12-22: qty 1

## 2023-12-22 NOTE — Consult Note (Addendum)
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 [place].  History of Present Illness:   Natalie Alexander has no formal cardiac hx but recalls being seen by a [facility] cardiologist a long time ago for a possible murmur and had reported reassuring workup. In our system she was admitted 12/2021 with Covid-19 PNA, respiratory failure, and encephalopathy from hypercarbia and had echo showing EF 60-65%, moderate asymmetric LVH of the basal septal segment, G2DD, LVOT flow acceleration with peak gradient , mild pHTN, mild AS. Non-cardiac CTA then noted cardiomegaly, LVH, 2v coronary atherosclerosis, aortic atherosclerosis, and calcifications of the aortic valve. 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 Troponins were elevated to peak of 349 at that time felt due to demand ischemia. She quit smoking after that admission. Her mom and dad both had heart issues, unclear exactly what, father had PPM, twin sister has murmur.  She has been out of her medicines for several months due to loss of insurance. She reports after 12/2021 admission she was fired from her job. She presented to [facility] ED 12/20/23 due to worsening swelling in her legs and shortness of breath  for a week. Admit H/P reports symptoms for a few weeks but she corrected herself that it was only about a week. She has not had any chest pain or palpitations. She reports that sometimes when she is sick instead of she herself getting symptoms, her twin sister Natalie Alexander feel them and call her to tell her about them, though they've had a falling out recently.  Upon arrival she was found to be hypoxic 88% RA with tachypnea, tachycardia (AF RVR 150s-160s), and hypertensive 180/104. CXR showed cardiomegaly with vascular congestion. LE duplex was neg for DVT. CTA showed no acute PE, + emphysema, + cardiomegaly, dilated PA, incompletely visualized mild hydronephrosis of left kidney with small left kidney stone, aortic atherosclerosis. She was transferred to [facility] for further management. Cr 0.73->1.05->1.03, LDL 120, A1c 6.7, Mg OK, hsTroponins 28->31, BNP 641, Covid/flu neg. She received IV diltiazem bolus and then started on drip, heparin then Lovenox (110mg  sq q24hr), several doses of IV Lasix, 5mg  IV Lopressor, then transitioned to oral metoprolol. 2D echo this admission showed EF 30-35%, moderately decreased function, mildly reduced RV function, moderately enlarged RV, severe biatrial enlargement, mild-moderate mitral regurgitation, moderate tricuspid regurgitation. Covid/flu neg. She appears to be diuresing well by I/O's. Prior weight in 2023 was 164lb, measured weight 194lb this admission. She reports edema has resolved with the IV Lasix. She denies having any SOB presently. HR remains 100s-120s. Diltiazem drip was stopped yesterday and metoprolol has been titrated, bumped to 75mg  BID starting this AM.   Past Medical History:  Diagnosis Date   COPD (chronic obstructive pulmonary disease) (  HCC)    HTN (hypertension)     History reviewed. No pertinent surgical history.   Home Medications:  Prior to Admission medications   Medication Sig Start Date End Date Taking? Authorizing Provider  amLODipine (NORVASC) 5  MG tablet Take 5 mg by mouth daily.   Yes [provider]  aspirin EC 81 MG tablet Take 81 mg by mouth daily. Swallow whole.   Yes [provider]  metoprolol succinate (TOPROL-XL) 50 MG 24 hr tablet Take 50 mg by mouth daily. 02/20/22  Yes [provider]  OVER THE COUNTER MEDICATION Take 2 Doses by mouth daily. Beet Chew   Yes [provider]  Tiotropium Bromide-Olodaterol (STIOLTO RESPIMAT) 2.5-2.5 MCG/ACT AERS Inhale 2 puffs into the lungs daily. 04/02/22  Yes Martina Sinner, MD    Inpatient Medications: Scheduled Meds:  arformoterol  15 mcg Nebulization BID   And   umeclidinium bromide  1 puff Inhalation Daily   atorvastatin  40 mg Oral Daily   enoxaparin (LOVENOX) injection  110 mg Subcutaneous Q24H   furosemide  40 mg Intravenous Q12H   metoprolol tartrate  75 mg Oral BID   sodium chloride flush  3 mL Intravenous Q12H   Continuous Infusions:  PRN Meds: acetaminophen **OR** acetaminophen, albuterol, metoprolol tartrate, ondansetron **OR** ondansetron (ZOFRAN) IV, oxyCODONE, polyethylene glycol  Allergies:   No Known Allergies  Social History:   Social History   Socioeconomic History   Marital status: Divorced    Spouse name: Not on file   Number of children: Not on file   Years of education: Not on file   Highest education level: Not on file  Occupational History   Not on file  Tobacco Use   Smoking status: Every Day    Types: Cigarettes   Smokeless tobacco: Never  Substance and Sexual Activity   Alcohol use: Not on file   Drug use: Not on file   Sexual activity: Not on file  Other Topics Concern   Not on file  Social History Narrative   Not on file   Social Drivers of Health   Financial Resource Strain: Not on file  Food Insecurity: No Food Insecurity (12/20/2023)   Hunger Vital Sign    Worried About Running Out of Food in the Last Year: Never true    Ran Out of Food in the Last Year: Never true  Transportation Needs:  No Transportation Needs (12/20/2023)   PRAPARE - Administrator, Civil Service (Medical): No    Lack of Transportation (Non-Medical): No  Physical Activity: Not on file  Stress: Not on file  Social Connections: Unknown (05/14/2022)   [facility] from [facility]   Social Network    Social Network: Not on file  Intimate Partner Violence: Not At Risk (12/20/2023)   Humiliation, Afraid, Rape, and Kick questionnaire    Fear of Current or Ex-Partner: No    Emotionally Abused: No    Physically Abused: No    Sexually Abused: No    Family History:   Family History  Problem Relation Age of Onset   Heart Problems Mother        unknown details   Heart Problems Father        pacemaker   Heart murmur Sister      ROS:  Please see the history of present illness. Occcasionally sees strobe light in her left field of vision. All other ROS reviewed and negative.     Physical Exam/Data:  Vitals:   12/22/23 0012 12/22/23 0433 12/22/23 0524 12/22/23 0730  BP: 106/72 (!) 110/94  (!) 116/102  Pulse: (!) 101 (!) 103  (!) 109  Resp: 18 18  17   Temp:  98.2 F (36.8 C)  97.7 F (36.5 C)  TempSrc:  Oral  Oral  SpO2: 96% 98%  92%  Weight:   88.4 kg   Height:        Intake/Output Summary (Last 24 hours) at 12/22/2023 1133 Last data filed at 12/22/2023 0848 Gross per 24 hour  Intake 2017.33 ml  Output 3100 ml  Net -1082.67 ml      12/22/2023    5:24 AM 12/20/2023   12:37 PM 03/01/2022    2:56 PM  Last 3 Weights  Weight (lbs) 194 lb 14.4 oz 164 lb 0.4 oz 164 lb  Weight (kg) 88.406 kg 74.4 kg 74.39 kg     Body mass index is 28.78 kg/m.  General: Well developed, well nourished, in no acute distress. Head: Normocephalic, atraumatic, sclera non-icteric, no xanthomas, nares are without discharge. Neck: Negative for carotid bruits. JVP not elevated. Lungs: Decreased BS throughout with faint left basilar crackles. No wheezing or rhonchi. Breathing is  unlabored. Heart: Irregularly irregular, tachycardic, S1 S2 without murmurs, rubs, or gallops.  Abdomen: Soft, non-tender, non-distended with normoactive bowel sounds. No rebound/guarding. Extremities: No clubbing or cyanosis. No edema. Distal pedal pulses are 2+ and equal bilaterally. Neuro: Alert and oriented X 3. Moves all extremities spontaneously. Psych:  Responds to questions appropriately with a normal affect.  EKG:  The EKG was personally reviewed and demonstrates:  atrial fibrillation 154bpm, nonspcific STTW changes, possible LVH  Telemetry:  Telemetry was personally reviewed and demonstrates:  AF RVR  Relevant CV Studies: 2D echo 12/21/23  1. Left ventricular ejection fraction, by estimation, is 30 to 35%. The left ventricle has moderately decreased function. The left ventricle has no regional wall motion abnormalities. Left ventricular diastolic parameters are indeterminate.   2. Right ventricular systolic function is mildly reduced. The right ventricular size is moderately enlarged. There is normal pulmonary artery systolic pressure.   3. Left atrial size was severely dilated.   4. Right atrial size was severely dilated.   5. The mitral valve is abnormal. Mild to moderate mitral valve regurgitation. No evidence of mitral stenosis.   6. Tricuspid valve regurgitation is moderate.   7. The aortic valve is tricuspid. There is mild calcification of the aortic valve. Aortic valve regurgitation is not visualized. Aortic valve sclerosis/calcification is present, without any evidence of aortic stenosis.   8. The inferior vena cava is normal in size with <50% respiratory variability, suggesting right atrial pressure of 8 mmHg. Comparison(s): Prior images reviewed side by side. Ventricular function  has decreased.   Laboratory Data:  High Sensitivity Troponin:   Recent Labs  Lab 12/20/23 1302 12/20/23 1450  TROPONINIHS 28* 31*     Chemistry Recent Labs  Lab 12/20/23 1302  12/21/23 0637 12/22/23 0256  NA 140 141 139  K 3.7 3.3* 3.8  CL 103 104 104  CO2 26 29 26   GLUCOSE 113* 134* 122*  BUN 12 11 16   CREATININE 0.73 1.05* 1.03*  CALCIUM 9.2 9.0 9.3  MG 1.9 2.3 2.2  GFRNONAA >60 58* 59*  ANIONGAP 11 8 9     Recent Labs  Lab 12/20/23 1302  PROT 7.7  ALBUMIN 4.3  AST 30  ALT 37  ALKPHOS 93  BILITOT 0.9   Lipids  Recent Labs  Lab 12/22/23 0256  CHOL 177  TRIG 93  HDL 38*  LDLCALC 120*  CHOLHDL 4.7    Hematology Recent Labs  Lab 12/20/23 1302 12/21/23 0637 12/22/23 0256  WBC 7.6 6.5 5.5  RBC 5.38* 4.90 4.96  HGB 14.5 13.3 13.4  HCT 46.0 42.6 43.0  MCV 85.5 86.9 86.7  MCH 27.0 27.1 27.0  MCHC 31.5 31.2 31.2  RDW 15.6* 15.6* 15.9*  PLT 191 190 199   Thyroid  Recent Labs  Lab 12/20/23 1302  TSH 1.305    BNP Recent Labs  Lab 12/20/23 1302  BNP 641.9*    DDimer  Recent Labs  Lab 12/20/23 1302  DDIMER 0.62*     Radiology/Studies:  ECHOCARDIOGRAM COMPLETE Result Date: 12/21/2023    ECHOCARDIOGRAM REPORT   Patient Name:   CAMYRN CATANIA Date of Exam: 12/21/2023 Medical Rec #:  [id number]         Height:       69.0 in Accession #:    [id number]        Weight:       164.0 lb Date of Birth:  May 03, 1956        BSA:          1.899 m Patient Age:    67 years          BP:           114/101 mmHg Patient Gender: F                 HR:           90 bpm. Exam Location:  Inpatient Procedure: 2D Echo, Cardiac Doppler and Color Doppler Indications:    CHF-Acute Diastolic I50.31, Atrial Fibrillation I48.91  History:        Patient has prior history of Echocardiogram examinations, most                 recent 01/08/2022. CHF, COPD, Arrythmias:Atrial Fibrillation; Risk                 Factors:Hypertension.  Sonographer:    Lucendia Herrlich RCS Referring Phys: [id number] TIMOTHY S OPYD IMPRESSIONS  1. Left ventricular ejection fraction, by estimation, is 30 to 35%. The left ventricle has moderately decreased function. The left ventricle has no  regional wall motion abnormalities. Left ventricular diastolic parameters are indeterminate.  2. Right ventricular systolic function is mildly reduced. The right ventricular size is moderately enlarged. There is normal pulmonary artery systolic pressure.  3. Left atrial size was severely dilated.  4. Right atrial size was severely dilated.  5. The mitral valve is abnormal. Mild to moderate mitral valve regurgitation. No evidence of mitral stenosis.  6. Tricuspid valve regurgitation is moderate.  7. The aortic valve is tricuspid. There is mild calcification of the aortic valve. Aortic valve regurgitation is not visualized. Aortic valve sclerosis/calcification is present, without any evidence of aortic stenosis.  8. The inferior vena cava is normal in size with <50% respiratory variability, suggesting right atrial pressure of 8 mmHg. Comparison(s): Prior images reviewed side by side. Ventricular function has decreased. FINDINGS  Left Ventricle: Left ventricular ejection fraction, by estimation, is 30 to 35%. The left ventricle has moderately decreased function. The left ventricle has no regional wall motion abnormalities. The left ventricular internal cavity size was normal in size. There is no left ventricular hypertrophy. Left ventricular diastolic parameters are indeterminate. Right Ventricle: The right ventricular size is moderately enlarged. No increase in right ventricular wall thickness.  Right ventricular systolic function is mildly reduced. There is normal pulmonary artery systolic pressure. The tricuspid regurgitant velocity is 2.41 m/s, and with an assumed right atrial pressure of 8 mmHg, the estimated right ventricular systolic pressure is 31.2 mmHg. Left Atrium: Left atrial size was severely dilated. Right Atrium: Right atrial size was severely dilated. Pericardium: There is no evidence of pericardial effusion. Mitral Valve: The mitral valve is abnormal. Mild to moderate mitral valve regurgitation. No  evidence of mitral valve stenosis. Tricuspid Valve: The tricuspid valve is normal in structure. Tricuspid valve regurgitation is moderate . No evidence of tricuspid stenosis. Aortic Valve: The aortic valve is tricuspid. There is mild calcification of the aortic valve. There is mild aortic valve annular calcification. Aortic valve regurgitation is not visualized. Aortic valve sclerosis/calcification is present, without any evidence of aortic stenosis. Aortic valve mean gradient measures 8.0 mmHg. Aortic valve peak gradient measures 14.3 mmHg. Aortic valve area, by VTI measures 0.77 cm. Pulmonic Valve: The pulmonic valve was not well visualized. Pulmonic valve regurgitation is mild. No evidence of pulmonic stenosis. Aorta: The aortic root, ascending aorta and aortic arch are all structurally normal, with no evidence of dilitation or obstruction. Venous: The inferior vena cava is normal in size with less than 50% respiratory variability, suggesting right atrial pressure of 8 mmHg. IAS/Shunts: The atrial septum is grossly normal.  LEFT VENTRICLE PLAX 2D LVIDd:         5.50 cm   Diastology LVIDs:         3.50 cm   LV e' medial:    6.22 cm/s LV PW:         0.60 cm   LV E/e' medial:  16.2 LV IVS:        1.10 cm   LV e' lateral:   12.60 cm/s LVOT diam:     2.10 cm   LV E/e' lateral: 8.0 LV SV:         20 LV SV Index:   11 LVOT Area:     3.46 cm  RIGHT VENTRICLE            IVC RV S prime:     7.83 cm/s  IVC diam: 2.00 cm TAPSE (M-mode): 1.1 cm LEFT ATRIUM             Index        RIGHT ATRIUM           Index LA diam:        5.10 cm 2.69 cm/m   RA Area:     27.80 cm LA Vol (A2C):   99.0 ml 52.13 ml/m  RA Volume:   99.60 ml  52.45 ml/m LA Vol (A4C):   85.2 ml 44.87 ml/m LA Biplane Vol: 98.9 ml 52.08 ml/m  AORTIC VALVE AV Area (Vmax):    0.83 cm AV Area (Vmean):   0.80 cm AV Area (VTI):     0.77 cm AV Vmax:           189.00 cm/s AV Vmean:          125.000 cm/s AV VTI:            0.261 m AV Peak Grad:      14.3 mmHg  AV Mean Grad:      8.0 mmHg LVOT Vmax:         45.50 cm/s LVOT Vmean:        28.967 cm/s LVOT VTI:  0.058 m LVOT/AV VTI ratio: 0.22  AORTA Ao Root diam: 3.20 cm Ao Asc diam:  3.20 cm MITRAL VALVE                TRICUSPID VALVE MV Area (PHT): 6.54 cm     TR Peak grad:   23.2 mmHg MV Decel Time: 116 msec     TR Vmax:        241.00 cm/s MR Peak grad: 55.1 mmHg MR Vmax:      371.00 cm/s   SHUNTS MV E velocity: 101.00 cm/s  Systemic VTI:  0.06 m                             Systemic Diam: 2.10 cm Riley Lam MD Electronically signed by Riley Lam MD Signature Date/Time: 12/21/2023/3:46:56 PM    Final    CT Angio Chest PE W and/or Wo Contrast Result Date: 12/20/2023 CLINICAL DATA:  Shortness of breath EXAM: CT ANGIOGRAPHY CHEST WITH CONTRAST TECHNIQUE: Multidetector CT imaging of the chest was performed using the standard protocol during bolus administration of intravenous contrast. Multiplanar CT image reconstructions and MIPs were obtained to evaluate the vascular anatomy. RADIATION DOSE REDUCTION: This exam was performed according to the departmental dose-optimization program which includes automated exposure control, adjustment of the mA and/or kV according to patient size and/or use of iterative reconstruction technique. CONTRAST:  75mL OMNIPAQUE IOHEXOL 350 MG/ML SOLN COMPARISON:  12/20/2023, 01/08/2022 chest CT FINDINGS: Cardiovascular: Satisfactory opacification of the pulmonary arteries to the segmental level. No evidence of pulmonary embolism. Moderate aortic atherosclerosis. No aneurysm. Dilated pulmonary trunk up to 3.9 cm. Coronary vascular calcification. Cardiomegaly. No pericardial effusion. Mediastinum/Nodes: Patent trachea. No thyroid mass. Mild mediastinal lymph nodes. Right paratracheal nodes measuring up to 16 mm. This is unchanged compared with 2023. Esophagus within normal limits. Small right hilar nodes measuring up to 12 mm, also unchanged. Lungs/Pleura: Emphysema. No  acute airspace disease, pleural effusion, or pneumothorax. Upper Abdomen: No acute finding. Incompletely visualized mild hydronephrosis left kidney. Small left kidney stone. Musculoskeletal: No acute or suspicious osseous abnormality. Review of the MIP images confirms the above findings. IMPRESSION: 1. Negative for acute pulmonary embolus. 2. Emphysema. No CT evidence for acute pulmonary abnormality. 3. Cardiomegaly. Dilated pulmonary trunk suggesting pulmonary arterial hypertension. 4. Incompletely visualized mild hydronephrosis left kidney. Small left kidney stone. 5. Aortic atherosclerosis. Aortic Atherosclerosis (ICD10-I70.0) and Emphysema (ICD10-J43.9). Electronically Signed   By: Jasmine Pang M.D.   On: 12/20/2023 17:42   US Venous Img Lower Bilateral (DVT) Result Date: 12/20/2023 CLINICAL DATA:  Shortness of breath and swelling EXAM: BILATERAL LOWER EXTREMITY VENOUS DOPPLER ULTRASOUND TECHNIQUE: Gray-scale sonography with graded compression, as well as color Doppler and duplex ultrasound were performed to evaluate the lower extremity deep venous systems from the level of the common femoral vein and including the common femoral, femoral, profunda femoral, popliteal and calf veins including the posterior tibial, peroneal and gastrocnemius veins when visible. The superficial great saphenous vein was also interrogated. Spectral Doppler was utilized to evaluate flow at rest and with distal augmentation maneuvers in the common femoral, femoral and popliteal veins. COMPARISON:  None Available. FINDINGS: RIGHT LOWER EXTREMITY Common Femoral Vein: No evidence of thrombus. Normal compressibility, respiratory phasicity and response to augmentation. Saphenofemoral Junction: No evidence of thrombus. Normal compressibility and flow on color Doppler imaging. Profunda Femoral Vein: No evidence of thrombus. Normal compressibility and flow on color Doppler imaging. Femoral Vein: No evidence of  thrombus. Normal  compressibility, respiratory phasicity and response to augmentation. Popliteal Vein: No evidence of thrombus. Normal compressibility, respiratory phasicity and response to augmentation. Calf Veins: No evidence of thrombus. Normal compressibility and flow on color Doppler imaging. Superficial Great Saphenous Vein: No evidence of thrombus. Normal compressibility. Venous Reflux:  None. Other Findings:  None. LEFT LOWER EXTREMITY Common Femoral Vein: No evidence of thrombus. Normal compressibility, respiratory phasicity and response to augmentation. Saphenofemoral Junction: No evidence of thrombus. Normal compressibility and flow on color Doppler imaging. Profunda Femoral Vein: No evidence of thrombus. Normal compressibility and flow on color Doppler imaging. Femoral Vein: No evidence of thrombus. Normal compressibility, respiratory phasicity and response to augmentation. Popliteal Vein: No evidence of thrombus. Normal compressibility, respiratory phasicity and response to augmentation. Calf Veins: No evidence of thrombus. Normal compressibility and flow on color Doppler imaging. Superficial Great Saphenous Vein: No evidence of thrombus. Normal compressibility. Venous Reflux:  None. Other Findings:  Soft tissue edema identified along the ankle. IMPRESSION: No evidence of deep venous thrombosis in either lower extremity. Electronically Signed   By: Karen Kays M.D.   On: 12/20/2023 16:24   DG Chest Portable 1 View Result Date: 12/20/2023 CLINICAL DATA:  Shortness of breath. EXAM: PORTABLE CHEST 1 VIEW COMPARISON:  None Available. FINDINGS: The heart is enlarged. Aortic atherosclerosis. Bilateral interstitial prominence. No focal consolidation. No sizable pleural effusion or pneumothorax. No acute osseous abnormality. IMPRESSION: Cardiomegaly with findings suggestive of pulmonary vascular congestion. Electronically Signed   By: Hart Robinsons M.D.   On: 12/20/2023 14:55     Assessment and Plan:   1. Acute  respiratory failure with hypoxia requiring O2 - suspect driven by CHF superimposed on baseline emphysema as evidenced by CT - patient states she does not have prior hx of COPD, that prior issues were related to chemical exposure but she was not able to get workman's comp for this. She was noted by pulm in 02/2022 to have nocturnal desats and recommended for overnight O2 at that time - Lindora Alviar need re-assessment of home O2 needs prior to DC  2. HFrEF, new cardiomyopathy, HTN - etiology of cardiomyopathy not completely known at this time, suspected possibly due to tachy-mediated from AF RVR or uncontrolled HTN, but also has risk factors for underlying obstructive CAD given coronary/aortic atherosclerosis on CT, DM, former tobacco abuse, HLD - appears to be diuresing well by I/O's, weight up 30lb from normal but unclear if this is some body weight gain as well - edema resolved, still mild faint crackles on exam - continue IV Lasix at present dosing and reassess volume status in AM - continue metoprolol with anticipation of consolidating to Toprol closer to DC - BP soft at times, hold off advancing GDMT at this time - she appears warm and perfused, mentating normally - per d/w MD, focus on AF control and HF management, and consider outpatient ischemic evaluation  3. Newly recognized atrial fibrillation with RVR, duration unknown - HR improved from admission but still suboptimally controlled 100s-120s - diltiazem drip d/c'd yesterday (avoiding with LV dysfunction), metoprolol being titrated to 75mg  BID today - suspect she would benefit from TEE DCCV this admission when euvolemic given drop in EF - might end up needing antiarrhythmic therapy given severe biatrial enlargement but would hold off preemptively starting amiodarone given risk of chemical conversion with unknown duration of arrhythmia; Twanna Resh send msg to cardmaster inbox to see if we can put on for Tuesday (Earnestine Shipp need formal orders in rounds tomorrow  once  finalized) - on Lovenox presently for anticoagulation - Idamae Coccia ask pharmacy to transition to Eliquis - IM has consulted TOC team for med assistance - TSH WNL  4. Mildly elevated troponin, coronary/aortic atherosclerosis by 12/2021 CT - suspect demand ischemia related to the above, cannot exclude underlying component of chronic coronary artery disease (had higher troponin in 12/2021 in 300s in setting of Covid-19 PNA, no prior baseline ischemic testing) - no overt chest pain; SOB improving with treatment of heart failure - see above regarding outpatient ischemic evaluation - started on atorvastatin this admission  5. Mild-moderate mitral regurgitation, moderate tricuspid regurgitation - follow clinically; PASP reported to be normal  6. Mild AKI - slight increase in Cr with diuresis but remaining stable, may reflect new baseline, follow  7. DM - A1c persistently above 6.5; further per IM  8. Visual changes - pt reports intermittently seeing strobe light pattern in her left eye for several days, states she notified prior provider at [facility] - relayed to Dr. [place] in case further workup felt needd  Risk Assessment/Risk Scores:       New York Heart Association (NYHA) Functional Class NYHA Class IV on arrival now III  CHA2DS2-VASc Score = 6   This indicates a 9.7% annual risk of stroke. The patient's score is based upon: CHF History: 1 HTN History: 1 Diabetes History: 1 Stroke History: 0 Vascular Disease History: 1 (atherosclerosis by CT) Age Score: 1 Gender Score: 1       For questions or updates, please contact [facility] Please consult [email or web address] for contact info under    Signed, Laurann Montana, PA-C  12/22/2023 11:33 AM   I have seen and examined this patient with Ronie Spies.  Agree with above, note added to reflect my findings.  Patient with a past history as above.  Over the last month and 1/2 to 2 months, she has been having more fatigue and  weakness.  Over the last couple days, she states that she has had more shortness of breath as well.  She presented to the emergency room and was found to be in atrial fibrillation with pulmonary vascular congestion on her chest x-ray.  Echo showed an ejection fraction of 30 to 35%.  She had been in rapid atrial fibrillation.  She also had severely dilated atria on her echo.  She feels better today, but she does state that her shortness of breath was with exertion, and she is not been out of bed and exerted herself since being in the hospital.  GEN: Well nourished, well developed, in no acute distress  HEENT: normal  Neck: no JVD, carotid bruits, or masses Cardiac: Irregular; no murmurs, rubs, or gallops,no edema  Respiratory:  clear to auscultation bilaterally, normal work of breathing GI: soft, nontender, nondistended, + BS MS: no deformity or atrophy  Skin: warm and dry Neuro:  Strength and sensation are intact Psych: euthymic mood, full affect   New onset atrial fibrillation: Potentially the cause of her heart failure.  Her heart rates were quite rapid when she presented to the hospital.  Currently on metoprolol with improvement in her heart rates.  Has been started on anticoagulation.  She would benefit from rhythm control prior to leaving the hospital due to her heart failure.  Lorianna Spadaccini continue diuresis, but Topaz Raglin likely need TEE and cardioversion.  If she needs an antiarrhythmic, would likely start her on amiodarone post cardioversion.  Due to her diagnosis of heart failure, ablation for atrial fibrillation as an  outpatient would be reasonable. Acute systolic heart failure: Etiology likely due to rapid atrial fibrillation, though she does have coronary calcification on CT.  Troponin was not indicative of ischemia during this admission.  Burton Gahan continue with diuresis.  Loryn Haacke need optimal heart failure medications at discharge. Coronary artery disease: Noted on prior CT scan with increased  calcifications.  Outpatient testing would be beneficial. Secondary hypercoagulable state: Currently on Lovenox.  Patte Winkel transition to Eliquis and prep for cardioversion.  Machai Desmith M. Semaja Lymon MD 12/22/2023 11:52 AM

## 2023-12-22 NOTE — Progress Notes (Signed)
Mobility Specialist Progress Note:    12/22/23 1347  Mobility  Activity Ambulated with assistance to bathroom  Level of Assistance Contact guard assist, steadying assist  Assistive Device Other (Comment) (HHA)  Distance Ambulated (ft) 10 ft  Activity Response Tolerated well  Mobility Referral Yes  Mobility visit 1 Mobility  Mobility Specialist Start Time (ACUTE ONLY) 1056  Mobility Specialist Stop Time (ACUTE ONLY) 1106  Mobility Specialist Time Calculation (min) (ACUTE ONLY) 10 min   Pt received in bed requesting assistance to the BR. No physical assistance needed, contact guard for safety. No c/o throughout. Left in BR instructed to use the call bell. All needs met.   Thompson Grayer Mobility Specialist  Please contact vis Secure Chat or  Rehab Office [phone]

## 2023-12-22 NOTE — Plan of Care (Signed)
Problem: Education: Goal: Knowledge of General Education information will improve Description: Including pain rating scale, medication(s)/side effects and non-pharmacologic comfort measures Outcome: Progressing   Problem: Health Behavior/Discharge Planning: Goal: Ability to manage health-related needs will improve Outcome: Progressing   Problem: Clinical Measurements: Goal: Ability to maintain clinical measurements within normal limits will improve Outcome: Progressing Goal: Will remain free from infection Outcome: Progressing Goal: Diagnostic test results will improve Outcome: Progressing Goal: Respiratory complications will improve Outcome: Progressing Goal: Cardiovascular complication will be avoided Outcome: Progressing   Problem: Activity: Goal: Risk for activity intolerance will decrease Outcome: Progressing   Problem: Nutrition: Goal: Adequate nutrition will be maintained Outcome: Progressing   Problem: Coping: Goal: Level of anxiety will decrease Outcome: Progressing   Problem: Elimination: Goal: Will not experience complications related to bowel motility Outcome: Progressing Goal: Will not experience complications related to urinary retention Outcome: Progressing   Problem: Pain Management: Goal: General experience of comfort will improve Outcome: Progressing   Problem: Safety: Goal: Ability to remain free from injury will improve Outcome: Progressing   Problem: Skin Integrity: Goal: Risk for impaired skin integrity will decrease Outcome: Progressing   Problem: Education: Goal: Ability to demonstrate management of disease process will improve Outcome: Progressing Goal: Ability to verbalize understanding of medication therapies will improve Outcome: Progressing Goal: Individualized Educational Video(s) Outcome: Progressing   Problem: Activity: Goal: Capacity to carry out activities will improve Outcome: Progressing   Problem: Cardiac: Goal:  Ability to achieve and maintain adequate cardiopulmonary perfusion will improve Outcome: Progressing   Problem: Education: Goal: Knowledge of disease or condition will improve Outcome: Progressing Goal: Understanding of medication regimen will improve Outcome: Progressing Goal: Individualized Educational Video(s) Outcome: Progressing   Problem: Activity: Goal: Ability to tolerate increased activity will improve Outcome: Progressing   Problem: Cardiac: Goal: Ability to achieve and maintain adequate cardiopulmonary perfusion will improve Outcome: Progressing   Problem: Health Behavior/Discharge Planning: Goal: Ability to safely manage health-related needs after discharge will improve Outcome: Progressing   Problem: Education: Goal: Knowledge of disease or condition will improve Outcome: Progressing Goal: Understanding of medication regimen will improve Outcome: Progressing Goal: Individualized Educational Video(s) Outcome: Progressing   Problem: Activity: Goal: Ability to tolerate increased activity will improve Outcome: Progressing   Problem: Cardiac: Goal: Ability to achieve and maintain adequate cardiopulmonary perfusion will improve Outcome: Progressing   Problem: Education: Goal: Knowledge of General Education information will improve Description: Including pain rating scale, medication(s)/side effects and non-pharmacologic comfort measures Outcome: Progressing   Problem: Health Behavior/Discharge Planning: Goal: Ability to manage health-related needs will improve Outcome: Progressing   Problem: Clinical Measurements: Goal: Ability to maintain clinical measurements within normal limits will improve Outcome: Progressing Goal: Will remain free from infection Outcome: Progressing Goal: Diagnostic test results will improve Outcome: Progressing Goal: Respiratory complications will improve Outcome: Progressing Goal: Cardiovascular complication will be  avoided Outcome: Progressing   Problem: Activity: Goal: Risk for activity intolerance will decrease Outcome: Progressing   Problem: Nutrition: Goal: Adequate nutrition will be maintained Outcome: Progressing   Problem: Coping: Goal: Level of anxiety will decrease Outcome: Progressing   Problem: Elimination: Goal: Will not experience complications related to bowel motility Outcome: Progressing Goal: Will not experience complications related to urinary retention Outcome: Progressing   Problem: Pain Management: Goal: General experience of comfort will improve Outcome: Progressing   Problem: Safety: Goal: Ability to remain free from injury will improve Outcome: Progressing   Problem: Skin Integrity: Goal: Risk for impaired skin integrity will decrease Outcome: Progressing

## 2023-12-22 NOTE — Progress Notes (Signed)
PHARMACY - ANTICOAGULATION CONSULT NOTE  Pharmacy Consult for lovenox > apixaban Indication: atrial fibrillation  No Known Allergies  Patient Measurements: Height: 5\' 9"  (175.3 cm) Weight: 88.4 kg (194 lb 14.4 oz) IBW/kg (Calculated) : 66.2  Vital Signs: Temp: 97.7 F (36.5 C) (12/22 0730) Temp Source: Oral (12/22 0730) BP: 116/102 (12/22 0730) Pulse Rate: 109 (12/22 0730)  Labs: Recent Labs    12/20/23 1302 12/20/23 1450 12/20/23 1934 12/21/23 0637 12/22/23 0256  HGB 14.5  --   --  13.3 13.4  HCT 46.0  --   --  42.6 43.0  PLT 191  --   --  190 199  HEPARINUNFRC  --   --  0.34 0.13*  --   CREATININE 0.73  --   --  1.05* 1.03*  TROPONINIHS 28* 31*  --   --   --     Estimated Creatinine Clearance: 62 mL/min (A) (by C-G formula based on SCr of 1.03 mg/dL (H)).   Medical History: Past Medical History:  Diagnosis Date   COPD (chronic obstructive pulmonary disease) (HCC)    HTN (hypertension)     Medications:  Infusions:    Assessment: 58 yof presented to the ED with ShOB and swollen legs, in Afib with RVR. To start IV heparin for afib. Baseline Hgb and platelets are WNL. She is not on anticoagulation PTA.  Pharmacy consulted for transition from lovenox to apixaban. With her age (26 yrs), Scr (1.03), and weight (88.4kg) she does not qualify for dose reduction.   Per RN, blood-tinged urine was present overnight, will continue to monitor for resolution.   Goal of Therapy:  Monitor platelets by anticoagulation protocol: Yes  Plan:  STOP lovenox START apixaban 5 mg twice daily, give first dose when today's dose of lovenox is due Monitor CBC, signs/symptoms of bleeding   Romie Minus, PharmD PGY1 Pharmacy Resident  Please check AMION for all [facility] Pharmacy phone numbers After 10:00 PM, call Main Pharmacy [phone]  12/22/2023 12:35 PM

## 2023-12-22 NOTE — Progress Notes (Addendum)
PROGRESS NOTE    Natalie Alexander  FTD:[id number] DOB: 03-25-1956 DOA: 12/20/2023 PCP: Patient, No Pcp Per    Brief Narrative:   Natalie Alexander is a 67 y.o. female with past medical history significant for HTN, COPD who presented to [facility] ED on 12/20 from home with progressive shortness of breath and lower extremity edema over the last week.  She reports that lost her insurance and has not taken her medications in at least 6 months (amlodipine, metoprolol, Stiolto).  Reports worse with exertion.  Denies cough, no fever/chills, no chest pain, abdominal pain.  In the ED, temperature 97.7 F, HR 162, RR 22, BP 180/104, SpO2 91% on room air and placed on 2 L nasal cannula.  WBC 7.6, hemoglobin 14.5, platelet count 191.  Sodium 140, potassium 3.7, chloride 103, CO2 26, glucose 113, BUN 12, creat 0.73.  AST 30, ALT 37, total bilirubin 0.9.  High sensitive troponin 28 followed by 31.  BNP 641.9.  TSH 1.305.  COVID/RSV/influenza PCR negative.  Chest x-ray with cardiomegaly with findings suggestive of pulmonary vascular congestion.  CT angiogram chest negative for pulmonary embolism, cardiomegaly, dilated pulmonary trunk suggesting pulmonary HTN, incompletely visualized mild hydronephrosis left kidney, small left kidney stone, aortic atherosclerosis.  Patient was given 20 g IV diltiazem, started on diltiazem infusion, IV heparin drip, given 5 mg IV Lopressor and 40 mg IV Lasix.  TRH was consulted and patient was transferred to [facility] hospital for further evaluation and management of new A-fib with RVR, CHF exacerbation.  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  Patient is afebrile without leukocytosis.  BNP elevated 641.9  with chest x-ray findings of cardiomegaly with pulmonary vascular congestion.  CT angiogram chest negative for pulmonary embolism.  Vascular duplex ultrasound bilateral lower extremities negative for DVT.  Has been noncompliant with her home antihypertensives due to insurance issues.  -- Cardiology following, appreciate assistance -- TTE: LVEF 30-35% (decreased from 60-65% Jan 2023), no LV RWMA, severe BAE, mod TR, IVC normal -- Furosemide 40 mg IV every 12 hours -- Strict I's and O's and daily weights -- Continue supplemental oxygen, maintain SpO2 greater than 88%, currently on 2 L nasal cannula -- BMP daily  Atrial fibrillation with RVR, new diagnosis On arrival to the ED, patient was noted to be tachycardic on telemetry with EKG consistent with atrial fibrillation with RVR, rates up to 162.  TSH within normal limits.  No previous diagnosis of arrhythmia. -- Metoprolol tartrate increase to 75 mg p.o. BID -- Eliquis -- Will need benefit check for Eliquis/Xarelto; secure chat sent to Rx advocate group -- Pending TEE/DCCV 12/24 per cardiology  Hypertensive urgency Hx essential hypertension Blood pressure elevated 180/104 admission, has been out of her home antihypertensives to include metoprolol and amlodipine for 6 months. -- Metoprolol tartrate increased to 75 mg p.o. BID  Hyperlipidemia Total cholesterol 177, HDL 38, LDL 120. -- Start atorvastatin 40 mg p.o. daily  Hypokalemia Repleted, potassium 3.8 this morning. -- BMP daily  COPD Follows with pulmonology outpatient, Dr. Francine Graven.  Has been out of her inhaler, Stioloto for 6 months due to insurance issues. -- Brovana neb twice daily -- Incruse Ellipta 1 puff daily -- Albuterol neb every 4 hours.  Wheezing/shortness of breath  History of tobacco use disorder Patient reports abstinence  for 2 years.  Continue to encourage to remain abstinent.  Medical noncompliance Patient reports has lost her insurance and has been out of her  home medications for 6 months. -- TOC consultation    DVT prophylaxis: Lovenox    Code Status: Full Code Family Communication: No family present at bedside  Disposition Plan:  Level of care: Telemetry Cardiac Status is: Inpatient Remains inpatient appropriate because: needs transition to oral medications with stability of heart rate and blood pressure before stable for discharge home, anticipate medication needs, pending TOC consultation    Consultants:  Cardiology  Procedures:  TTE  Antimicrobials:  None   Subjective: Patient seen examined bedside, resting calmly.  Lying in bed.  Reports poor sleep overnight.  Discussed new findings on echocardiogram with reduced LVEF.  Remains tachycardic.  Increasing metoprolol dose this morning.  Will consult cardiology given drop in EF, likely tachycardia versus poorly controlled HTN induced.  Continues to deny chest pain.  Continues with mild dyspnea.  Remains on 2 L nasal cannula.   No other specific questions, concerns or complaints at this time.  Denies headache, no visual changes, no chest pain, no palpitations, no nausea/vomiting/diarrhea, no cough/congestion, no abdominal pain, no focal weakness, no fatigue, no paresthesias.  No acute events overnight per nursing staff.  Objective: Vitals:   12/22/23 0012 12/22/23 0433 12/22/23 0524 12/22/23 0730  BP: 106/72 (!) 110/94  (!) 116/102  Pulse: (!) 101 (!) 103  (!) 109  Resp: 18 18  17   Temp:  98.2 F (36.8 C)  97.7 F (36.5 C)  TempSrc:  Oral  Oral  SpO2: 96% 98%  92%  Weight:   88.4 kg   Height:        Intake/Output Summary (Last 24 hours) at 12/22/2023 0956 Last data filed at 12/22/2023 0848 Gross per 24 hour  Intake 2260.33 ml  Output 3100 ml  Net -839.67 ml   Filed Weights   12/20/23 1237 12/22/23 0524  Weight: 74.4 kg 88.4 kg    Examination:  Physical Exam: GEN: NAD, alert and oriented x 3, wd/wn HEENT: NCAT, PERRL, EOMI, sclera clear, MMM PULM: Breath sounds  decreased bilateral bases, no wheezing/crackles, normal respiratory effort without accessory muscle use, on 2 L nasal cannula with SpO2 98% at rest CV: IIR, tachycardic w/o M/G/R GI: abd soft, NTND, NABS, no R/G/M MSK: Trace bilateral lower extremity peripheral edema, muscle strength globally intact 5/5 bilateral upper/lower extremities NEURO: CN II-XII intact, no focal deficits, sensation to light touch intact PSYCH: normal mood/affect Integumentary: dry/intact, no rashes or wounds    Data Reviewed: I have personally reviewed following labs and imaging studies  CBC: Recent Labs  Lab 12/20/23 1302 12/21/23 0637 12/22/23 0256  WBC 7.6 6.5 5.5  NEUTROABS 6.1  --   --   HGB 14.5 13.3 13.4  HCT 46.0 42.6 43.0  MCV 85.5 86.9 86.7  PLT 191 190 199   Basic Metabolic Panel: Recent Labs  Lab 12/20/23 1302 12/21/23 0637 12/22/23 0256  NA 140 141 139  K 3.7 3.3* 3.8  CL 103 104 104  CO2 26 29 26   GLUCOSE 113* 134* 122*  BUN 12 11 16   CREATININE 0.73 1.05* 1.03*  CALCIUM 9.2 9.0 9.3  MG 1.9 2.3 2.2   GFR: Estimated Creatinine Clearance: 62 mL/min (A) (by C-G formula based on SCr of 1.03 mg/dL (H)). Liver Function Tests: Recent Labs  Lab 12/20/23 1302  AST 30  ALT 37  ALKPHOS 93  BILITOT 0.9  PROT 7.7  ALBUMIN 4.3   No results for input(s): "LIPASE", "AMYLASE" in the last 168 hours. No results for input(s): "AMMONIA" in the last 168 hours. Coagulation Profile: No results for input(s): "INR", "PROTIME" in the last 168 hours. Cardiac Enzymes: No results for input(s): "CKTOTAL", "CKMB", "CKMBINDEX", "TROPONINI" in the last 168 hours. BNP (last 3 results) No results for input(s): "PROBNP" in the last 8760 hours. HbA1C: Recent Labs    12/22/23 0256  HGBA1C 6.7*   CBG: Recent Labs  Lab 12/20/23 1223  GLUCAP 130*   Lipid Profile: Recent Labs    12/22/23 0256  CHOL 177  HDL 38*  LDLCALC 120*  TRIG 93  CHOLHDL 4.7   Thyroid Function Tests: Recent Labs     12/20/23 1302  TSH 1.305   Anemia Panel: No results for input(s): "VITAMINB12", "FOLATE", "FERRITIN", "TIBC", "IRON", "RETICCTPCT" in the last 72 hours. Sepsis Labs: No results for input(s): "PROCALCITON", "LATICACIDVEN" in the last 168 hours.  Recent Results (from the past 240 hours)  Resp panel by RT-PCR (RSV, Flu A&B, Covid) Anterior Nasal Swab     Status: None   Collection Time: 12/20/23  1:02 PM   Specimen: Anterior Nasal Swab  Result Value Ref Range Status   SARS Coronavirus 2 by RT PCR NEGATIVE NEGATIVE Final    Comment: (NOTE) SARS-CoV-2 target nucleic acids are NOT DETECTED.  The SARS-CoV-2 RNA is generally detectable in upper respiratory specimens during the acute phase of infection. The lowest concentration of SARS-CoV-2 viral copies this assay can detect is 138 copies/mL. A negative result does not preclude SARS-Cov-2 infection and should not be used as the sole basis for treatment or other patient management decisions. A negative result may occur with  improper specimen collection/handling, submission of specimen other than nasopharyngeal swab, presence of viral mutation(s) within the areas targeted by this assay, and inadequate number of viral copies(<138 copies/mL). A negative result must be combined with clinical observations, patient history, and epidemiological information. The expected result is Negative.  Fact Sheet for Patients:  BloggerCourse.com  Fact Sheet for Healthcare Providers:  SeriousBroker.it  This test is no t yet approved or cleared by the [place] FDA and  has been authorized for detection and/or diagnosis of SARS-CoV-2 by FDA under an Emergency Use Authorization (EUA). This EUA will remain  in effect (meaning this test can be used) for the duration of the COVID-19 declaration under Section 564(b)(1) of the Act, 21 U.S.C.section 360bbb-3(b)(1), unless the authorization is terminated  or  revoked sooner.       Influenza A by PCR NEGATIVE NEGATIVE Final   Influenza B by PCR NEGATIVE NEGATIVE Final    Comment: (NOTE) The Xpert Xpress SARS-CoV-2/FLU/RSV plus assay is intended as an aid in the diagnosis of influenza from Nasopharyngeal swab specimens and should not be used as a sole basis for treatment. Nasal washings and aspirates are unacceptable for Xpert Xpress SARS-CoV-2/FLU/RSV testing.  Fact Sheet for Patients: BloggerCourse.com  Fact Sheet for Healthcare Providers: SeriousBroker.it  This test is not yet approved or cleared by the [place] FDA and has been authorized for detection and/or diagnosis of SARS-CoV-2 by FDA under an Emergency Use Authorization (EUA). This EUA will remain in effect (meaning this test can be used) for the duration of the COVID-19 declaration under Section 564(b)(1) of the Act, 21 U.S.C. section 360bbb-3(b)(1), unless the authorization is terminated or revoked.     Resp Syncytial Virus by PCR NEGATIVE NEGATIVE Final    Comment: (NOTE)  Fact Sheet for Patients: BloggerCourse.com  Fact Sheet for Healthcare Providers: SeriousBroker.it  This test is not yet approved or cleared by the [place] FDA and has been authorized for detection and/or diagnosis of SARS-CoV-2 by FDA under an Emergency Use Authorization (EUA). This EUA will remain in effect (meaning this test can be used) for the duration of the COVID-19 declaration under Section 564(b)(1) of the Act, 21 U.S.C. section 360bbb-3(b)(1), unless the authorization is terminated or revoked.  Performed at [facility], [address], [place], [place] [address]          Radiology Studies: ECHOCARDIOGRAM COMPLETE Result Date: 12/21/2023    ECHOCARDIOGRAM REPORT   Patient Name:   BRESHAY GLEBA Date of Exam: 12/21/2023 Medical Rec #:  [id number]          Height:       69.0 in Accession #:    [id number]        Weight:       164.0 lb Date of Birth:  07-25-1956        BSA:          1.899 m Patient Age:    67 years          BP:           114/101 mmHg Patient Gender: F                 HR:           90 bpm. Exam Location:  Inpatient Procedure: 2D Echo, Cardiac Doppler and Color Doppler Indications:    CHF-Acute Diastolic I50.31, Atrial Fibrillation I48.91  History:        Patient has prior history of Echocardiogram examinations, most                 recent 01/08/2022. CHF, COPD, Arrythmias:Atrial Fibrillation; Risk                 Factors:Hypertension.  Sonographer:    Lucendia Herrlich RCS Referring Phys: [id number] TIMOTHY S OPYD IMPRESSIONS  1. Left ventricular ejection fraction, by estimation, is 30 to 35%. The left ventricle has moderately decreased function. The left ventricle has no regional wall motion abnormalities. Left ventricular diastolic parameters are indeterminate.  2. Right ventricular systolic function is mildly reduced. The right ventricular size is moderately enlarged. There is normal pulmonary artery systolic pressure.  3. Left atrial size was severely dilated.  4. Right atrial size was severely dilated.  5. The mitral valve is abnormal. Mild to moderate mitral valve regurgitation. No evidence of mitral stenosis.  6. Tricuspid valve regurgitation is moderate.  7. The aortic valve is tricuspid. There is mild calcification of the aortic valve. Aortic valve regurgitation is not visualized. Aortic valve sclerosis/calcification is present, without any evidence of aortic stenosis.  8. The inferior vena cava is normal in size with <50% respiratory variability, suggesting right atrial pressure of 8 mmHg. Comparison(s): Prior images reviewed side by side. Ventricular function has decreased. FINDINGS  Left Ventricle: Left ventricular ejection fraction, by estimation, is 30 to 35%. The left ventricle has moderately decreased function. The left ventricle has no  regional wall motion abnormalities. The left ventricular internal cavity size was normal in size. There is no left ventricular hypertrophy. Left ventricular diastolic parameters are indeterminate. Right Ventricle: The right ventricular size is moderately enlarged. No increase in right ventricular wall thickness. Right ventricular systolic function is mildly reduced. There is normal pulmonary artery systolic pressure. The tricuspid regurgitant velocity  is 2.41 m/s, and with an assumed right atrial pressure of 8 mmHg, the estimated right ventricular systolic pressure is 31.2 mmHg. Left Atrium: Left atrial size was severely dilated. Right Atrium: Right atrial size was severely dilated. Pericardium: There is no evidence of pericardial effusion. Mitral Valve: The mitral valve is abnormal. Mild to moderate mitral valve regurgitation. No evidence of mitral valve stenosis. Tricuspid Valve: The tricuspid valve is normal in structure. Tricuspid valve regurgitation is moderate . No evidence of tricuspid stenosis. Aortic Valve: The aortic valve is tricuspid. There is mild calcification of the aortic valve. There is mild aortic valve annular calcification. Aortic valve regurgitation is not visualized. Aortic valve sclerosis/calcification is present, without any evidence of aortic stenosis. Aortic valve mean gradient measures 8.0 mmHg. Aortic valve peak gradient measures 14.3 mmHg. Aortic valve area, by VTI measures 0.77 cm. Pulmonic Valve: The pulmonic valve was not well visualized. Pulmonic valve regurgitation is mild. No evidence of pulmonic stenosis. Aorta: The aortic root, ascending aorta and aortic arch are all structurally normal, with no evidence of dilitation or obstruction. Venous: The inferior vena cava is normal in size with less than 50% respiratory variability, suggesting right atrial pressure of 8 mmHg. IAS/Shunts: The atrial septum is grossly normal.  LEFT VENTRICLE PLAX 2D LVIDd:         5.50 cm   Diastology  LVIDs:         3.50 cm   LV e' medial:    6.22 cm/s LV PW:         0.60 cm   LV E/e' medial:  16.2 LV IVS:        1.10 cm   LV e' lateral:   12.60 cm/s LVOT diam:     2.10 cm   LV E/e' lateral: 8.0 LV SV:         20 LV SV Index:   11 LVOT Area:     3.46 cm  RIGHT VENTRICLE            IVC RV S prime:     7.83 cm/s  IVC diam: 2.00 cm TAPSE (M-mode): 1.1 cm LEFT ATRIUM             Index        RIGHT ATRIUM           Index LA diam:        5.10 cm 2.69 cm/m   RA Area:     27.80 cm LA Vol (A2C):   99.0 ml 52.13 ml/m  RA Volume:   99.60 ml  52.45 ml/m LA Vol (A4C):   85.2 ml 44.87 ml/m LA Biplane Vol: 98.9 ml 52.08 ml/m  AORTIC VALVE AV Area (Vmax):    0.83 cm AV Area (Vmean):   0.80 cm AV Area (VTI):     0.77 cm AV Vmax:           189.00 cm/s AV Vmean:          125.000 cm/s AV VTI:            0.261 m AV Peak Grad:      14.3 mmHg AV Mean Grad:      8.0 mmHg LVOT Vmax:         45.50 cm/s LVOT Vmean:        28.967 cm/s LVOT VTI:          0.058 m LVOT/AV VTI ratio: 0.22  AORTA Ao Root diam: 3.20 cm Ao Asc diam:  3.20  cm MITRAL VALVE                TRICUSPID VALVE MV Area (PHT): 6.54 cm     TR Peak grad:   23.2 mmHg MV Decel Time: 116 msec     TR Vmax:        241.00 cm/s MR Peak grad: 55.1 mmHg MR Vmax:      371.00 cm/s   SHUNTS MV E velocity: 101.00 cm/s  Systemic VTI:  0.06 m                             Systemic Diam: 2.10 cm Natalie Lam MD Electronically signed by Natalie Lam MD Signature Date/Time: 12/21/2023/3:46:56 PM    Final    CT Angio Chest PE W and/or Wo Contrast Result Date: 12/20/2023 CLINICAL DATA:  Shortness of breath EXAM: CT ANGIOGRAPHY CHEST WITH CONTRAST TECHNIQUE: Multidetector CT imaging of the chest was performed using the standard protocol during bolus administration of intravenous contrast. Multiplanar CT image reconstructions and MIPs were obtained to evaluate the vascular anatomy. RADIATION DOSE REDUCTION: This exam was performed according to the departmental  dose-optimization program which includes automated exposure control, adjustment of the mA and/or kV according to patient size and/or use of iterative reconstruction technique. CONTRAST:  75mL OMNIPAQUE IOHEXOL 350 MG/ML SOLN COMPARISON:  12/20/2023, 01/08/2022 chest CT FINDINGS: Cardiovascular: Satisfactory opacification of the pulmonary arteries to the segmental level. No evidence of pulmonary embolism. Moderate aortic atherosclerosis. No aneurysm. Dilated pulmonary trunk up to 3.9 cm. Coronary vascular calcification. Cardiomegaly. No pericardial effusion. Mediastinum/Nodes: Patent trachea. No thyroid mass. Mild mediastinal lymph nodes. Right paratracheal nodes measuring up to 16 mm. This is unchanged compared with 2023. Esophagus within normal limits. Small right hilar nodes measuring up to 12 mm, also unchanged. Lungs/Pleura: Emphysema. No acute airspace disease, pleural effusion, or pneumothorax. Upper Abdomen: No acute finding. Incompletely visualized mild hydronephrosis left kidney. Small left kidney stone. Musculoskeletal: No acute or suspicious osseous abnormality. Review of the MIP images confirms the above findings. IMPRESSION: 1. Negative for acute pulmonary embolus. 2. Emphysema. No CT evidence for acute pulmonary abnormality. 3. Cardiomegaly. Dilated pulmonary trunk suggesting pulmonary arterial hypertension. 4. Incompletely visualized mild hydronephrosis left kidney. Small left kidney stone. 5. Aortic atherosclerosis. Aortic Atherosclerosis (ICD10-I70.0) and Emphysema (ICD10-J43.9). Electronically Signed   By: Natalie Alexander M.D.   On: 12/20/2023 17:42   US Venous Img Lower Bilateral (DVT) Result Date: 12/20/2023 CLINICAL DATA:  Shortness of breath and swelling EXAM: BILATERAL LOWER EXTREMITY VENOUS DOPPLER ULTRASOUND TECHNIQUE: Gray-scale sonography with graded compression, as well as color Doppler and duplex ultrasound were performed to evaluate the lower extremity deep venous systems from the  level of the common femoral vein and including the common femoral, femoral, profunda femoral, popliteal and calf veins including the posterior tibial, peroneal and gastrocnemius veins when visible. The superficial great saphenous vein was also interrogated. Spectral Doppler was utilized to evaluate flow at rest and with distal augmentation maneuvers in the common femoral, femoral and popliteal veins. COMPARISON:  None Available. FINDINGS: RIGHT LOWER EXTREMITY Common Femoral Vein: No evidence of thrombus. Normal compressibility, respiratory phasicity and response to augmentation. Saphenofemoral Junction: No evidence of thrombus. Normal compressibility and flow on color Doppler imaging. Profunda Femoral Vein: No evidence of thrombus. Normal compressibility and flow on color Doppler imaging. Femoral Vein: No evidence of thrombus. Normal compressibility, respiratory phasicity and response to augmentation. Popliteal Vein: No evidence of thrombus. Normal compressibility,  respiratory phasicity and response to augmentation. Calf Veins: No evidence of thrombus. Normal compressibility and flow on color Doppler imaging. Superficial Great Saphenous Vein: No evidence of thrombus. Normal compressibility. Venous Reflux:  None. Other Findings:  None. LEFT LOWER EXTREMITY Common Femoral Vein: No evidence of thrombus. Normal compressibility, respiratory phasicity and response to augmentation. Saphenofemoral Junction: No evidence of thrombus. Normal compressibility and flow on color Doppler imaging. Profunda Femoral Vein: No evidence of thrombus. Normal compressibility and flow on color Doppler imaging. Femoral Vein: No evidence of thrombus. Normal compressibility, respiratory phasicity and response to augmentation. Popliteal Vein: No evidence of thrombus. Normal compressibility, respiratory phasicity and response to augmentation. Calf Veins: No evidence of thrombus. Normal compressibility and flow on color Doppler imaging.  Superficial Great Saphenous Vein: No evidence of thrombus. Normal compressibility. Venous Reflux:  None. Other Findings:  Soft tissue edema identified along the ankle. IMPRESSION: No evidence of deep venous thrombosis in either lower extremity. Electronically Signed   By: Natalie Alexander M.D.   On: 12/20/2023 16:24   DG Chest Portable 1 View Result Date: 12/20/2023 CLINICAL DATA:  Shortness of breath. EXAM: PORTABLE CHEST 1 VIEW COMPARISON:  None Available. FINDINGS: The heart is enlarged. Aortic atherosclerosis. Bilateral interstitial prominence. No focal consolidation. No sizable pleural effusion or pneumothorax. No acute osseous abnormality. IMPRESSION: Cardiomegaly with findings suggestive of pulmonary vascular congestion. Electronically Signed   By: Hart Robinsons M.D.   On: 12/20/2023 14:55        Scheduled Meds:  arformoterol  15 mcg Nebulization BID   And   umeclidinium bromide  1 puff Inhalation Daily   atorvastatin  40 mg Oral Daily   enoxaparin (LOVENOX) injection  110 mg Subcutaneous Q24H   furosemide  40 mg Intravenous Q12H   metoprolol tartrate  75 mg Oral BID   sodium chloride flush  3 mL Intravenous Q12H   Continuous Infusions:     LOS: 2 days    Time spent: 52 minutes spent on chart review, discussion with nursing staff, consultants, updating family and interview/physical exam; more than 50% of that time was spent in counseling and/or coordination of care.    Alvira Philips [place], DO [facility] Available via Epic secure chat 7am-7pm After these hours, please refer to coverage provider listed on [email or web address] 12/22/2023, 9:56 AM

## 2023-12-22 NOTE — Discharge Instructions (Signed)

## 2023-12-23 ENCOUNTER — Other Ambulatory Visit (HOSPITAL_COMMUNITY): Payer: Self-pay

## 2023-12-23 ENCOUNTER — Inpatient Hospital Stay (HOSPITAL_COMMUNITY): Payer: Medicare Other

## 2023-12-23 ENCOUNTER — Encounter (HOSPITAL_COMMUNITY): Payer: Self-pay | Admitting: Family Medicine

## 2023-12-23 DIAGNOSIS — I4891 Unspecified atrial fibrillation: Secondary | ICD-10-CM | POA: Diagnosis not present

## 2023-12-23 LAB — CBC
HCT: 46.4 % — ABNORMAL HIGH (ref 36.0–46.0)
Hemoglobin: 14.4 g/dL (ref 12.0–15.0)
MCH: 27.3 pg (ref 26.0–34.0)
MCHC: 31 g/dL (ref 30.0–36.0)
MCV: 87.9 fL (ref 80.0–100.0)
Platelets: 196 10*3/uL (ref 150–400)
RBC: 5.28 MIL/uL — ABNORMAL HIGH (ref 3.87–5.11)
RDW: 15.8 % — ABNORMAL HIGH (ref 11.5–15.5)
WBC: 6.5 10*3/uL (ref 4.0–10.5)
nRBC: 0 % (ref 0.0–0.2)

## 2023-12-23 LAB — BRAIN NATRIURETIC PEPTIDE: B Natriuretic Peptide: 679.4 pg/mL — ABNORMAL HIGH (ref 0.0–100.0)

## 2023-12-23 LAB — BASIC METABOLIC PANEL
Anion gap: 10 (ref 5–15)
BUN: 24 mg/dL — ABNORMAL HIGH (ref 8–23)
CO2: 26 mmol/L (ref 22–32)
Calcium: 9.3 mg/dL (ref 8.9–10.3)
Chloride: 103 mmol/L (ref 98–111)
Creatinine, Ser: 1.1 mg/dL — ABNORMAL HIGH (ref 0.44–1.00)
GFR, Estimated: 55 mL/min — ABNORMAL LOW (ref >60)
Glucose, Bld: 109 mg/dL — ABNORMAL HIGH (ref 70–99)
Potassium: 4.3 mmol/L (ref 3.5–5.1)
Sodium: 139 mmol/L (ref 135–145)

## 2023-12-23 LAB — MAGNESIUM: Magnesium: 2.1 mg/dL (ref 1.7–2.4)

## 2023-12-23 MED ORDER — STIOLTO RESPIMAT 2.5-2.5 MCG/ACT IN AERS
2.0000 | INHALATION_SPRAY | Freq: Every day | RESPIRATORY_TRACT | 6 refills | Status: DC
  Filled 2023-12-23: qty 4, 30d supply, fill #0

## 2023-12-23 MED ORDER — HYDROCORTISONE 1 % EX CREA
1.0000 | TOPICAL_CREAM | Freq: Three times a day (TID) | CUTANEOUS | Status: DC | PRN
  Filled 2023-12-23: qty 28

## 2023-12-23 MED ORDER — ATORVASTATIN CALCIUM 40 MG PO TABS
40.0000 mg | ORAL_TABLET | Freq: Every day | ORAL | 2 refills | Status: DC
  Filled 2023-12-23: qty 30, 30d supply, fill #0

## 2023-12-23 MED ORDER — METOPROLOL TARTRATE 25 MG PO TABS
75.0000 mg | ORAL_TABLET | Freq: Two times a day (BID) | ORAL | 2 refills | Status: DC
  Filled 2023-12-23: qty 180, 30d supply, fill #0

## 2023-12-23 MED ORDER — GUAIFENESIN ER 600 MG PO TB12
1200.0000 mg | ORAL_TABLET | Freq: Two times a day (BID) | ORAL | Status: DC
  Administered 2023-12-23 – 2023-12-26 (×6): 1200 mg via ORAL
  Filled 2023-12-23 (×6): qty 2

## 2023-12-23 MED ORDER — APIXABAN 5 MG PO TABS
5.0000 mg | ORAL_TABLET | Freq: Two times a day (BID) | ORAL | 0 refills | Status: DC
  Filled 2023-12-23: qty 60, 30d supply, fill #0

## 2023-12-23 NOTE — Progress Notes (Signed)
Natalie Alexander  UVO:[id number] DOB: July 29, 1956 DOA: 12/20/2023 PCP: Patient, No Pcp Per    Brief Narrative:  67 year old with a history of HTN and COPD who presented to the ER 12/20 with a week of progressively worsening shortness of breath and lower extremity edema.  She reported that she had recently lost her insurance and therefore had not been able to take her medications for at least 6 months.  In the ER she was found to be in atrial fibrillation with a heart rate of 162.  CXR was consistent with pulmonary edema.  CTa chest was negative for PE.  Goals of Care:   Code Status: Full Code   DVT prophylaxis:  apixaban (ELIQUIS) tablet 5 mg   Interim Hx: Heart rate reasonably well-controlled at 100-130.  Blood pressure modestly low at 100-109 systolic.  Oxygen saturation 90% room air.  Having significant airway secretions and expectorating thick phlegm at time of my visit.  Is alert and oriented.  No new complaints.  Denies current chest pain.  Assessment & Plan:  Newly diagnosed atrial fibrillation with acute RVR Heart rate 162 at presentation - TSH within normal limits - Eliquis initiated - continue metoprolol - cardioversion planned for 12/24 per cardiology  Newly diagnosed acute combined systolic and diastolic CHF TTE this admission noted EF 30-35% with grade 2 DD  - previous TTE January 2023 noted EF 60-65% - continue diuresis - net negative approximately 2.8 L since admission  Filed Weights   12/20/23 1237 12/22/23 0524 12/23/23 0641  Weight: 74.4 kg 88.4 kg 87.1 kg     Pulmonary edema with acute hypoxic respiratory failure Oxygen saturation 88% on room air with tachypnea at presentation - due to pulmonary edema related to CHF -continue diuresis  HTN with hypertensive urgency Blood pressure 180/104 at presentation -had been out of her home blood pressure medicines for months  Coronary artery disease Calcification of coronary arteries noted on CT scanning -Cardiology  suggest outpatient evaluation  HLD LDL 120 - atorvastatin initiated  Hypokalemia Corrected with supplementation  COPD Followed at [facility] - has been without her Stioloto inhaler for 6 months -maintenance inhaled medications resumed  Tobacco abuse Patient reports that she quit smoking 2 years ago  Family Communication: No family present at time of exam Disposition: Awaiting TEE cardioversion 12/24 - PT/OT to see patient though accurate assessment of her capabilities will not likely be able to be made until after cardioversion   Objective: Blood pressure 109/64, pulse 100, temperature 97.7 F (36.5 C), temperature source Oral, resp. rate 20, height 5\' 9"  (1.753 m), weight 87.1 kg, SpO2 92%.  Intake/Output Summary (Last 24 hours) at 12/23/2023 0829 Last data filed at 12/23/2023 0754 Gross per 24 hour  Intake 60 ml  Output 1900 ml  Net -1840 ml   Filed Weights   12/20/23 1237 12/22/23 0524 12/23/23 0641  Weight: 74.4 kg 88.4 kg 87.1 kg    Examination: General: No acute respiratory distress Lungs: Coarse upper airway sounds transmitted throughout without active wheezing with good breath sounds bilaterally Cardiovascular: Irregularly irregular without murmur Abdomen: Nontender, nondistended, soft, bowel sounds positive, no rebound, no ascites, no appreciable mass Extremities: No significant cyanosis, clubbing, or edema bilateral lower extremities  CBC: Recent Labs  Lab 12/20/23 1302 12/21/23 0637 12/22/23 0256 12/23/23 0239  WBC 7.6 6.5 5.5 6.5  NEUTROABS 6.1  --   --   --   HGB 14.5 13.3 13.4 14.4  HCT 46.0 42.6 43.0 46.4*  MCV 85.5  86.9 86.7 87.9  PLT 191 190 199 196   Basic Metabolic Panel: Recent Labs  Lab 12/21/23 0637 12/22/23 0256 12/23/23 0239  NA 141 139 139  K 3.3* 3.8 4.3  CL 104 104 103  CO2 29 26 26   GLUCOSE 134* 122* 109*  BUN 11 16 24*  CREATININE 1.05* 1.03* 1.10*  CALCIUM 9.0 9.3 9.3  MG 2.3 2.2 2.1   GFR: Estimated  Creatinine Clearance: 57.6 mL/min (A) (by C-G formula based on SCr of 1.1 mg/dL (H)).   Scheduled Meds:  apixaban  5 mg Oral BID   arformoterol  15 mcg Nebulization BID   And   umeclidinium bromide  1 puff Inhalation Daily   atorvastatin  40 mg Oral Daily   furosemide  40 mg Intravenous Q12H   metoprolol tartrate  75 mg Oral BID   sodium chloride flush  3 mL Intravenous Q12H      LOS: 3 days   Lonia Blood, MD [facility] Office  [phone] Pager - Text Page per Loretha Stapler  If 7PM-7AM, please contact night-coverage per Amion 12/23/2023, 8:29 AM

## 2023-12-23 NOTE — Progress Notes (Signed)
Progress Note  Patient Name: Natalie Alexander Date of Encounter: 12/23/2023  Primary Cardiologist:   None   Subjective   Breathing better but not at baseline.  No pain. Does not notice atrial fib.   Inpatient Medications    Scheduled Meds:  apixaban  5 mg Oral BID   arformoterol  15 mcg Nebulization BID   And   umeclidinium bromide  1 puff Inhalation Daily   atorvastatin  40 mg Oral Daily   furosemide  40 mg Intravenous Q12H   metoprolol tartrate  75 mg Oral BID   sodium chloride flush  3 mL Intravenous Q12H   Continuous Infusions:  PRN Meds: acetaminophen **OR** [DISCONTINUED] acetaminophen, metoprolol tartrate, ondansetron **OR** ondansetron (ZOFRAN) IV, oxyCODONE, polyethylene glycol   Vital Signs    Vitals:   12/23/23 0526 12/23/23 0641 12/23/23 0752 12/23/23 0904  BP: 100/70  109/64   Pulse: 78  100   Resp: 18  20   Temp: 97.6 F (36.4 C)  97.7 F (36.5 C)   TempSrc: Oral  Oral   SpO2: 93%  92% 93%  Weight:  87.1 kg    Height:        Intake/Output Summary (Last 24 hours) at 12/23/2023 1001 Last data filed at 12/23/2023 0754 Gross per 24 hour  Intake 60 ml  Output 1900 ml  Net -1840 ml   Filed Weights   12/20/23 1237 12/22/23 0524 12/23/23 0641  Weight: 74.4 kg 88.4 kg 87.1 kg    Telemetry    Atrial fib with rate mildly increased  - Personally Reviewed  ECG    NA - Personally Reviewed  Physical Exam   GEN: No acute distress.   Neck: No  JVD Cardiac: Irregular RR, no murmurs, rubs, or gallops.  Respiratory:      Decreased breath sounds.  GI: Soft, nontender, non-distended  MS: No  edema; No deformity. Neuro:  Nonfocal  Psych: Normal affect   Labs    Chemistry Recent Labs  Lab 12/20/23 1302 12/21/23 0637 12/22/23 0256 12/23/23 0239  NA 140 141 139 139  K 3.7 3.3* 3.8 4.3  CL 103 104 104 103  CO2 26 29 26 26   GLUCOSE 113* 134* 122* 109*  BUN 12 11 16  24*  CREATININE 0.73 1.05* 1.03* 1.10*  CALCIUM 9.2 9.0 9.3 9.3   PROT 7.7  --   --   --   ALBUMIN 4.3  --   --   --   AST 30  --   --   --   ALT 37  --   --   --   ALKPHOS 93  --   --   --   BILITOT 0.9  --   --   --   GFRNONAA >60 58* 59* 55*  ANIONGAP 11 8 9 10      Hematology Recent Labs  Lab 12/21/23 0637 12/22/23 0256 12/23/23 0239  WBC 6.5 5.5 6.5  RBC 4.90 4.96 5.28*  HGB 13.3 13.4 14.4  HCT 42.6 43.0 46.4*  MCV 86.9 86.7 87.9  MCH 27.1 27.0 27.3  MCHC 31.2 31.2 31.0  RDW 15.6* 15.9* 15.8*  PLT 190 199 196    Cardiac EnzymesNo results for input(s): "TROPONINI" in the last 168 hours. No results for input(s): "TROPIPOC" in the last 168 hours.   BNP Recent Labs  Lab 12/20/23 1302 12/23/23 0239  BNP 641.9* 679.4*     DDimer  Recent Labs  Lab 12/20/23 1302  DDIMER  0.62*     Radiology    ECHOCARDIOGRAM COMPLETE Result Date: 12/21/2023    ECHOCARDIOGRAM REPORT   Patient Name:   Natalie Alexander Date of Exam: 12/21/2023 Medical Rec #:  [id number]         Height:       69.0 in Accession #:    [id number]        Weight:       164.0 lb Date of Birth:  07-09-1956        BSA:          1.899 m Patient Age:    67 years          BP:           114/101 mmHg Patient Gender: F                 HR:           90 bpm. Exam Location:  Inpatient Procedure: 2D Echo, Cardiac Doppler and Color Doppler Indications:    CHF-Acute Diastolic I50.31, Atrial Fibrillation I48.91  History:        Patient has prior history of Echocardiogram examinations, most                 recent 01/08/2022. CHF, COPD, Arrythmias:Atrial Fibrillation; Risk                 Factors:Hypertension.  Sonographer:    Lucendia Herrlich RCS Referring Phys: [id number] TIMOTHY S OPYD IMPRESSIONS  1. Left ventricular ejection fraction, by estimation, is 30 to 35%. The left ventricle has moderately decreased function. The left ventricle has no regional wall motion abnormalities. Left ventricular diastolic parameters are indeterminate.  2. Right ventricular systolic function is mildly reduced. The  right ventricular size is moderately enlarged. There is normal pulmonary artery systolic pressure.  3. Left atrial size was severely dilated.  4. Right atrial size was severely dilated.  5. The mitral valve is abnormal. Mild to moderate mitral valve regurgitation. No evidence of mitral stenosis.  6. Tricuspid valve regurgitation is moderate.  7. The aortic valve is tricuspid. There is mild calcification of the aortic valve. Aortic valve regurgitation is not visualized. Aortic valve sclerosis/calcification is present, without any evidence of aortic stenosis.  8. The inferior vena cava is normal in size with <50% respiratory variability, suggesting right atrial pressure of 8 mmHg. Comparison(s): Prior images reviewed side by side. Ventricular function has decreased. FINDINGS  Left Ventricle: Left ventricular ejection fraction, by estimation, is 30 to 35%. The left ventricle has moderately decreased function. The left ventricle has no regional wall motion abnormalities. The left ventricular internal cavity size was normal in size. There is no left ventricular hypertrophy. Left ventricular diastolic parameters are indeterminate. Right Ventricle: The right ventricular size is moderately enlarged. No increase in right ventricular wall thickness. Right ventricular systolic function is mildly reduced. There is normal pulmonary artery systolic pressure. The tricuspid regurgitant velocity is 2.41 m/s, and with an assumed right atrial pressure of 8 mmHg, the estimated right ventricular systolic pressure is 31.2 mmHg. Left Atrium: Left atrial size was severely dilated. Right Atrium: Right atrial size was severely dilated. Pericardium: There is no evidence of pericardial effusion. Mitral Valve: The mitral valve is abnormal. Mild to moderate mitral valve regurgitation. No evidence of mitral valve stenosis. Tricuspid Valve: The tricuspid valve is normal in structure. Tricuspid valve regurgitation is moderate . No evidence of  tricuspid stenosis. Aortic Valve: The aortic valve is tricuspid. There is mild  calcification of the aortic valve. There is mild aortic valve annular calcification. Aortic valve regurgitation is not visualized. Aortic valve sclerosis/calcification is present, without any evidence of aortic stenosis. Aortic valve mean gradient measures 8.0 mmHg. Aortic valve peak gradient measures 14.3 mmHg. Aortic valve area, by VTI measures 0.77 cm. Pulmonic Valve: The pulmonic valve was not well visualized. Pulmonic valve regurgitation is mild. No evidence of pulmonic stenosis. Aorta: The aortic root, ascending aorta and aortic arch are all structurally normal, with no evidence of dilitation or obstruction. Venous: The inferior vena cava is normal in size with less than 50% respiratory variability, suggesting right atrial pressure of 8 mmHg. IAS/Shunts: The atrial septum is grossly normal.  LEFT VENTRICLE PLAX 2D LVIDd:         5.50 cm   Diastology LVIDs:         3.50 cm   LV e' medial:    6.22 cm/s LV PW:         0.60 cm   LV E/e' medial:  16.2 LV IVS:        1.10 cm   LV e' lateral:   12.60 cm/s LVOT diam:     2.10 cm   LV E/e' lateral: 8.0 LV SV:         20 LV SV Index:   11 LVOT Area:     3.46 cm  RIGHT VENTRICLE            IVC RV S prime:     7.83 cm/s  IVC diam: 2.00 cm TAPSE (M-mode): 1.1 cm LEFT ATRIUM             Index        RIGHT ATRIUM           Index LA diam:        5.10 cm 2.69 cm/m   RA Area:     27.80 cm LA Vol (A2C):   99.0 ml 52.13 ml/m  RA Volume:   99.60 ml  52.45 ml/m LA Vol (A4C):   85.2 ml 44.87 ml/m LA Biplane Vol: 98.9 ml 52.08 ml/m  AORTIC VALVE AV Area (Vmax):    0.83 cm AV Area (Vmean):   0.80 cm AV Area (VTI):     0.77 cm AV Vmax:           189.00 cm/s AV Vmean:          125.000 cm/s AV VTI:            0.261 m AV Peak Grad:      14.3 mmHg AV Mean Grad:      8.0 mmHg LVOT Vmax:         45.50 cm/s LVOT Vmean:        28.967 cm/s LVOT VTI:          0.058 m LVOT/AV VTI ratio: 0.22  AORTA Ao Root  diam: 3.20 cm Ao Asc diam:  3.20 cm MITRAL VALVE                TRICUSPID VALVE MV Area (PHT): 6.54 cm     TR Peak grad:   23.2 mmHg MV Decel Time: 116 msec     TR Vmax:        241.00 cm/s MR Peak grad: 55.1 mmHg MR Vmax:      371.00 cm/s   SHUNTS MV E velocity: 101.00 cm/s  Systemic VTI:  0.06 m  Systemic Diam: 2.10 cm Riley Lam MD Electronically signed by Riley Lam MD Signature Date/Time: 12/21/2023/3:46:56 PM    Final     Cardiac Studies    1. Left ventricular ejection fraction, by estimation, is 30 to 35%. The  left ventricle has moderately decreased function. The left ventricle has  no regional wall motion abnormalities. Left ventricular diastolic  parameters are indeterminate.   2. Right ventricular systolic function is mildly reduced. The right  ventricular size is moderately enlarged. There is normal pulmonary artery  systolic pressure.   3. Left atrial size was severely dilated.   4. Right atrial size was severely dilated.   5. The mitral valve is abnormal. Mild to moderate mitral valve  regurgitation. No evidence of mitral stenosis.   6. Tricuspid valve regurgitation is moderate.   7. The aortic valve is tricuspid. There is mild calcification of the  aortic valve. Aortic valve regurgitation is not visualized. Aortic valve  sclerosis/calcification is present, without any evidence of aortic  stenosis.   8. The inferior vena cava is normal in size with <50% respiratory  variability, suggesting right atrial pressure of 8 mmHg.   Patient Profile     67 y.o. female former with a hx of HTN, COPD (patient refutes; indicates related to chemical exposure), DM (A1c >6.5 in 2023), former tobacco use, coronary/aortic atherosclerosis, prior elevated troponin, LVH, mild AS who is being seen 12/22/2023 for the evaluation of newly recognized HFrEF and atrial fib at the request of Dr. [place].   Assessment & Plan     Acute respiratory failure  with hypoxia requiring O2:  Dropped her sats with ambulation.  Has previously had home O2 but not currently.  She wold likeby benefit from this.     HFrEF, new cardiomyopathy:  Likely secondary to atrial fib with RVR.   PlanTEE/DCCV in the AM.  Risks and benefits of transesophageal echocardiogram have been explained including risks of esophageal damage, perforation (1:10,000 risk), bleeding, pharyngeal hematoma as well as other potential complications associated with conscious sedation including aspiration, arrhythmia, respiratory failure and death. Alternatives to treatment were discussed, questions were answered. Patient is willing to proceed.   Continue Eliquis.  BP is low.  No further med titration today.  She is tolerating Lasix.  Continue IV today.     HTN:   BP is controlled.  Continue beta blocker for now. See above.   Newly recognized atrial fibrillation with RVR, duration unknown: TEE/DCCV as above.    Mildly elevated troponin:    Not suspecting acute coronary syndrome.  We will follow the EF as an out patient and consider ischemia work up if it does not improve with med titration and sinus rhythm.    Mild-moderate mitral regurgitation, moderate tricuspid regurgitation:  Follow up as above with repeat echo.   Mild AKI:  Creat up slightly this admission.  Follow with labs tomorrow.   DM:  Per primary team.    For questions or updates, please contact [facility] Please consult [email or web address] for contact info under Cardiology/STEMI.   Signed, Rollene Rotunda, MD  12/23/2023, 10:01 AM

## 2023-12-23 NOTE — Progress Notes (Signed)
Heart Failure Nurse Navigator Progress Note  PCP: Patient, No Pcp Per PCP-Cardiologist: None Admission Diagnosis: Atrial fibrillation with RVR, Acute Congestive heart failure.  Admitted from: Home  Presentation:   Natalie Alexander presented with shortness of breath, swelling in her legs x 1 week, reported to having lost her job 6 months ago and hasn't taken any medications due to insurance loss. BP 142/102, HR 117, BNP 679, EKG showed Atrial fibrillation with RVR, CXR with cardiomegaly findings suggestive of pulmonary vascular congestion. Plan for cardioversion 12/24/23.   Patient was educated on the sign and symptoms of heart failure, daily weights, when to call her doctor or go to the ED, Diet/ fluid restrictions, taking all medications as prescribed and attending all medical appointments. Patient verbalized her understanding of education, a HF TOC appointment was scheduled for 01/03/2024 @ 11 am.   ECHO/ LVEF: 30-35%  Clinical Course:  Past Medical History:  Diagnosis Date   COPD (chronic obstructive pulmonary disease) (HCC)    HTN (hypertension)      Social History   Socioeconomic History   Marital status: Divorced    Spouse name: Not on file   Number of children: Not on file   Years of education: Not on file   Highest education level: Not on file  Occupational History   Not on file  Tobacco Use   Smoking status: Every Day    Types: Cigarettes   Smokeless tobacco: Never  Substance and Sexual Activity   Alcohol use: Not on file   Drug use: Not on file   Sexual activity: Not on file  Other Topics Concern   Not on file  Social History Narrative   Not on file   Social Drivers of Health   Financial Resource Strain: Not on file  Food Insecurity: No Food Insecurity (12/20/2023)   Hunger Vital Sign    Worried About Running Out of Food in the Last Year: Never true    Ran Out of Food in the Last Year: Never true  Transportation Needs: No Transportation Needs (12/20/2023)    PRAPARE - Administrator, Civil Service (Medical): No    Lack of Transportation (Non-Medical): No  Physical Activity: Not on file  Stress: Not on file  Social Connections: Unknown (05/14/2022)   Received from [facility]   Social Network    Social Network: Not on file   Education Assessment and Provision:  Detailed education and instructions provided on heart failure disease management including the following:  Signs and symptoms of Heart Failure When to call the physician Importance of daily weights Low sodium diet Fluid restriction Medication management Anticipated future follow-up appointments  Patient education given on each of the above topics.  Patient acknowledges understanding via teach back method and acceptance of all instructions.  Education Materials:  "Living Better With Heart Failure" Booklet, HF zone tool, & Daily Weight Tracker Tool.  Patient has scale at home: No, will buy one Patient has pill box at home: NA    High Risk Criteria for Readmission and/or Poor Patient Outcomes: Heart failure hospital admissions (last 6 months): 1  No Show rate: 0 Difficult social situation: No Demonstrates medication adherence: No, stopped taking her medication about 6 months ago when she lost her job.  Primary Language: English Literacy level: Reading, writing, and comprehension  Barriers of Care:   Medication compliance/ Cost Diet/ fluid restrictions Daily weights HF education continued  Considerations/Referrals:   Referral made to Heart Failure Pharmacist Stewardship:  yes Referral made to Heart Failure CSW/NCM TOC: No Referral made to Heart & Vascular TOC clinic: Yes, 01/03/2024 @ 11 am  Items for Follow-up on DC/TOC: Medication compliance/ costs Diet/ fluid restrictions/daily weights Continued HF education   Rhae Hammock, BSN, RN Heart Failure Print production planner Chat Only

## 2023-12-23 NOTE — Progress Notes (Addendum)
   [facility] has been requested to perform a transesophageal echocardiogram on Natalie Alexander for atrial fibrillation.     The patient does NOT have any absolute or relative contraindications to a Transesophageal Echocardiogram (TEE).  The patient has: No other conditions that may impact this procedure.  The patient has been appropriately anticoagulated (received appropriate # of DOAC doses, on Lovenox or on IV Heparin and in therapeutic range).  After careful review of history and examination, the risks and benefits of transesophageal echocardiogram have been explained including risks of esophageal damage, perforation (1:10,000 risk), bleeding, pharyngeal hematoma as well as other potential complications associated with conscious sedation including aspiration, arrhythmia, respiratory failure and death. Alternatives to treatment were discussed, questions were answered. Patient is willing to proceed.   Signed, Roe Rutherford Sahid Borba, PA  12/23/2023 11:25 AM

## 2023-12-23 NOTE — Evaluation (Signed)
Occupational Therapy Evaluation Patient Details Name: Natalie Alexander MRN: [id number] DOB: May 01, 1956 Today's Date: 12/23/2023   History of Present Illness Pt is a 67 y/o F presenting to ED on 12/20 from home with BLE edema x1 week and SOB, has been unable to take medications for months due to loss of insurance. Admitted for A Fib RVR and acute respiratory failure requiring O2. PMH includes COPD and HTN   Clinical Impression   Pt reports living with her daughter at home, she is ind with ADLs/functional mobility. She primarily stays on the main level but goes upstairs when she has to shower. Pt currently needing supervision - CGA for ADLs, ind with bed mobility and supervision for transfers without AD. Pt desatting to 83% on RA with hallway ambulation, incr to 85% with PLB, but ultimately needed 1L O2 to return to mid 90's. Pt presenting with impairments listed below, will follow acutely. Anticipate no OT follow up needs at d/c.        If plan is discharge home, recommend the following: A little help with walking and/or transfers;A little help with bathing/dressing/bathroom;Assistance with cooking/housework;Assist for transportation;Help with stairs or ramp for entrance    Functional Status Assessment  Patient has had a recent decline in their functional status and demonstrates the ability to make significant improvements in function in a reasonable and predictable amount of time.  Equipment Recommendations  None recommended by OT (pt has all needed DME)    Recommendations for Other Services PT consult     Precautions / Restrictions Precautions Precautions: Fall Precaution Comments: watch O2 Restrictions Weight Bearing Restrictions Per Provider Order: No      Mobility Bed Mobility Overal bed mobility: Independent                  Transfers Overall transfer level: Needs assistance Equipment used: None Transfers: Sit to/from Stand Sit to Stand: Supervision                   Balance Overall balance assessment: Mild deficits observed, not formally tested                                         ADL either performed or assessed with clinical judgement   ADL Overall ADL's : Needs assistance/impaired Eating/Feeding: Supervision/ safety   Grooming: Supervision/safety   Upper Body Bathing: Contact guard assist   Lower Body Bathing: Contact guard assist   Upper Body Dressing : Contact guard assist   Lower Body Dressing: Contact guard assist   Toilet Transfer: Supervision/safety   Toileting- Clothing Manipulation and Hygiene: Supervision/safety       Functional mobility during ADLs: Supervision/safety       Vision   Vision Assessment?: No apparent visual deficits     Perception Perception: Not tested       Praxis Praxis: Not tested       Pertinent Vitals/Pain Pain Assessment Pain Assessment: No/denies pain     Extremity/Trunk Assessment Upper Extremity Assessment Upper Extremity Assessment: Overall WFL for tasks assessed   Lower Extremity Assessment Lower Extremity Assessment: Defer to PT evaluation   Cervical / Trunk Assessment Cervical / Trunk Assessment: Normal   Communication Communication Communication: No apparent difficulties   Cognition Arousal: Alert Behavior During Therapy: WFL for tasks assessed/performed Overall Cognitive Status: Within Functional Limits for tasks assessed  General Comments  SpO2 down to 83 %  on RA, incr to 90's with 1L O2    Exercises     Shoulder Instructions      Home Living Family/patient expects to be discharged to:: Private residence Living Arrangements: Children (daughter) Available Help at Discharge: Family;Available PRN/intermittently Type of Home: House Home Access: Level entry     Home Layout: Two level;Able to live on main level with bedroom/bathroom     Bathroom Shower/Tub: Tub/shower  unit         Home Equipment: Shower seat   Additional Comments: was 1L O2 baseline to sleep      Prior Functioning/Environment Prior Level of Function : Independent/Modified Independent                        OT Problem List: Decreased strength;Decreased range of motion;Decreased activity tolerance;Impaired balance (sitting and/or standing);Decreased safety awareness;Cardiopulmonary status limiting activity      OT Treatment/Interventions: Self-care/ADL training;Therapeutic exercise;Energy conservation;DME and/or AE instruction;Therapeutic activities;Patient/family education;Balance training    OT Goals(Current goals can be found in the care plan section) Acute Rehab OT Goals Patient Stated Goal: none stated OT Goal Formulation: With patient Time For Goal Achievement: 01/06/24 Potential to Achieve Goals: Good ADL Goals Pt/caregiver will Perform Home Exercise Program: Both right and left upper extremity;With written HEP provided;With Supervision Additional ADL Goal #1: pt will verbalize x3 energy conservation strategies in prep for ADL Additional ADL Goal #2: pt will be able to perform OOB standing functional task x10 min with SpO2 above 90% in order to improve activity tolerance for ADL  OT Frequency: Min 1X/week    Co-evaluation              AM-PAC OT "6 Clicks" Daily Activity     Outcome Measure Help from another person eating meals?: None Help from another person taking care of personal grooming?: None Help from another person toileting, which includes using toliet, bedpan, or urinal?: A Little Help from another person bathing (including washing, rinsing, drying)?: A Little Help from another person to put on and taking off regular upper body clothing?: A Little Help from another person to put on and taking off regular lower body clothing?: A Little 6 Click Score: 20   End of Session Equipment Utilized During Treatment: Oxygen Nurse Communication: Mobility  status;Other (comment) (O2)  Activity Tolerance: Patient tolerated treatment well Patient left: in bed;with call bell/phone within reach  OT Visit Diagnosis: Unsteadiness on feet (R26.81);Other abnormalities of gait and mobility (R26.89);Muscle weakness (generalized) (M62.81)                Time: 0272-5366 OT Time Calculation (min): 16 min Charges:  OT General Charges $OT Visit: 1 Visit OT Evaluation $OT Eval Low Complexity: 1 Low  Carver Fila, OTD, OTR/L SecureChat Preferred Acute Rehab [phone]   Carver Fila Koonce 12/23/2023, 4:58 PM

## 2023-12-23 NOTE — H&P (View-Only) (Signed)
Progress Note  Patient Name: Natalie Alexander Date of Encounter: 12/23/2023  Primary Cardiologist:   None   Subjective   Breathing better but not at baseline.  No pain. Does not notice atrial fib.   Inpatient Medications    Scheduled Meds:  apixaban  5 mg Oral BID   arformoterol  15 mcg Nebulization BID   And   umeclidinium bromide  1 puff Inhalation Daily   atorvastatin  40 mg Oral Daily   furosemide  40 mg Intravenous Q12H   metoprolol tartrate  75 mg Oral BID   sodium chloride flush  3 mL Intravenous Q12H   Continuous Infusions:  PRN Meds: acetaminophen **OR** [DISCONTINUED] acetaminophen, metoprolol tartrate, ondansetron **OR** ondansetron (ZOFRAN) IV, oxyCODONE, polyethylene glycol   Vital Signs    Vitals:   12/23/23 0526 12/23/23 0641 12/23/23 0752 12/23/23 0904  BP: 100/70  109/64   Pulse: 78  100   Resp: 18  20   Temp: 97.6 F (36.4 C)  97.7 F (36.5 C)   TempSrc: Oral  Oral   SpO2: 93%  92% 93%  Weight:  87.1 kg    Height:        Intake/Output Summary (Last 24 hours) at 12/23/2023 1001 Last data filed at 12/23/2023 0754 Gross per 24 hour  Intake 60 ml  Output 1900 ml  Net -1840 ml   Filed Weights   12/20/23 1237 12/22/23 0524 12/23/23 0641  Weight: 74.4 kg 88.4 kg 87.1 kg    Telemetry    Atrial fib with rate mildly increased  - Personally Reviewed  ECG    NA - Personally Reviewed  Physical Exam   GEN: No acute distress.   Neck: No  JVD Cardiac: Irregular RR, no murmurs, rubs, or gallops.  Respiratory:      Decreased breath sounds.  GI: Soft, nontender, non-distended  MS: No  edema; No deformity. Neuro:  Nonfocal  Psych: Normal affect   Labs    Chemistry Recent Labs  Lab 12/20/23 1302 12/21/23 0637 12/22/23 0256 12/23/23 0239  NA 140 141 139 139  K 3.7 3.3* 3.8 4.3  CL 103 104 104 103  CO2 26 29 26 26   GLUCOSE 113* 134* 122* 109*  BUN 12 11 16  24*  CREATININE 0.73 1.05* 1.03* 1.10*  CALCIUM 9.2 9.0 9.3 9.3   PROT 7.7  --   --   --   ALBUMIN 4.3  --   --   --   AST 30  --   --   --   ALT 37  --   --   --   ALKPHOS 93  --   --   --   BILITOT 0.9  --   --   --   GFRNONAA >60 58* 59* 55*  ANIONGAP 11 8 9 10      Hematology Recent Labs  Lab 12/21/23 0637 12/22/23 0256 12/23/23 0239  WBC 6.5 5.5 6.5  RBC 4.90 4.96 5.28*  HGB 13.3 13.4 14.4  HCT 42.6 43.0 46.4*  MCV 86.9 86.7 87.9  MCH 27.1 27.0 27.3  MCHC 31.2 31.2 31.0  RDW 15.6* 15.9* 15.8*  PLT 190 199 196    Cardiac EnzymesNo results for input(s): "TROPONINI" in the last 168 hours. No results for input(s): "TROPIPOC" in the last 168 hours.   BNP Recent Labs  Lab 12/20/23 1302 12/23/23 0239  BNP 641.9* 679.4*     DDimer  Recent Labs  Lab 12/20/23 1302  DDIMER  0.62*     Radiology    ECHOCARDIOGRAM COMPLETE Result Date: 12/21/2023    ECHOCARDIOGRAM REPORT   Patient Name:   TUCKER BARTLETT Date of Exam: 12/21/2023 Medical Rec #:  [id number]         Height:       69.0 in Accession #:    [id number]        Weight:       164.0 lb Date of Birth:  07-09-1956        BSA:          1.899 m Patient Age:    67 years          BP:           114/101 mmHg Patient Gender: F                 HR:           90 bpm. Exam Location:  Inpatient Procedure: 2D Echo, Cardiac Doppler and Color Doppler Indications:    CHF-Acute Diastolic I50.31, Atrial Fibrillation I48.91  History:        Patient has prior history of Echocardiogram examinations, most                 recent 01/08/2022. CHF, COPD, Arrythmias:Atrial Fibrillation; Risk                 Factors:Hypertension.  Sonographer:    Lucendia Herrlich RCS Referring Phys: [id number] TIMOTHY S OPYD IMPRESSIONS  1. Left ventricular ejection fraction, by estimation, is 30 to 35%. The left ventricle has moderately decreased function. The left ventricle has no regional wall motion abnormalities. Left ventricular diastolic parameters are indeterminate.  2. Right ventricular systolic function is mildly reduced. The  right ventricular size is moderately enlarged. There is normal pulmonary artery systolic pressure.  3. Left atrial size was severely dilated.  4. Right atrial size was severely dilated.  5. The mitral valve is abnormal. Mild to moderate mitral valve regurgitation. No evidence of mitral stenosis.  6. Tricuspid valve regurgitation is moderate.  7. The aortic valve is tricuspid. There is mild calcification of the aortic valve. Aortic valve regurgitation is not visualized. Aortic valve sclerosis/calcification is present, without any evidence of aortic stenosis.  8. The inferior vena cava is normal in size with <50% respiratory variability, suggesting right atrial pressure of 8 mmHg. Comparison(s): Prior images reviewed side by side. Ventricular function has decreased. FINDINGS  Left Ventricle: Left ventricular ejection fraction, by estimation, is 30 to 35%. The left ventricle has moderately decreased function. The left ventricle has no regional wall motion abnormalities. The left ventricular internal cavity size was normal in size. There is no left ventricular hypertrophy. Left ventricular diastolic parameters are indeterminate. Right Ventricle: The right ventricular size is moderately enlarged. No increase in right ventricular wall thickness. Right ventricular systolic function is mildly reduced. There is normal pulmonary artery systolic pressure. The tricuspid regurgitant velocity is 2.41 m/s, and with an assumed right atrial pressure of 8 mmHg, the estimated right ventricular systolic pressure is 31.2 mmHg. Left Atrium: Left atrial size was severely dilated. Right Atrium: Right atrial size was severely dilated. Pericardium: There is no evidence of pericardial effusion. Mitral Valve: The mitral valve is abnormal. Mild to moderate mitral valve regurgitation. No evidence of mitral valve stenosis. Tricuspid Valve: The tricuspid valve is normal in structure. Tricuspid valve regurgitation is moderate . No evidence of  tricuspid stenosis. Aortic Valve: The aortic valve is tricuspid. There is mild  calcification of the aortic valve. There is mild aortic valve annular calcification. Aortic valve regurgitation is not visualized. Aortic valve sclerosis/calcification is present, without any evidence of aortic stenosis. Aortic valve mean gradient measures 8.0 mmHg. Aortic valve peak gradient measures 14.3 mmHg. Aortic valve area, by VTI measures 0.77 cm. Pulmonic Valve: The pulmonic valve was not well visualized. Pulmonic valve regurgitation is mild. No evidence of pulmonic stenosis. Aorta: The aortic root, ascending aorta and aortic arch are all structurally normal, with no evidence of dilitation or obstruction. Venous: The inferior vena cava is normal in size with less than 50% respiratory variability, suggesting right atrial pressure of 8 mmHg. IAS/Shunts: The atrial septum is grossly normal.  LEFT VENTRICLE PLAX 2D LVIDd:         5.50 cm   Diastology LVIDs:         3.50 cm   LV e' medial:    6.22 cm/s LV PW:         0.60 cm   LV E/e' medial:  16.2 LV IVS:        1.10 cm   LV e' lateral:   12.60 cm/s LVOT diam:     2.10 cm   LV E/e' lateral: 8.0 LV SV:         20 LV SV Index:   11 LVOT Area:     3.46 cm  RIGHT VENTRICLE            IVC RV S prime:     7.83 cm/s  IVC diam: 2.00 cm TAPSE (M-mode): 1.1 cm LEFT ATRIUM             Index        RIGHT ATRIUM           Index LA diam:        5.10 cm 2.69 cm/m   RA Area:     27.80 cm LA Vol (A2C):   99.0 ml 52.13 ml/m  RA Volume:   99.60 ml  52.45 ml/m LA Vol (A4C):   85.2 ml 44.87 ml/m LA Biplane Vol: 98.9 ml 52.08 ml/m  AORTIC VALVE AV Area (Vmax):    0.83 cm AV Area (Vmean):   0.80 cm AV Area (VTI):     0.77 cm AV Vmax:           189.00 cm/s AV Vmean:          125.000 cm/s AV VTI:            0.261 m AV Peak Grad:      14.3 mmHg AV Mean Grad:      8.0 mmHg LVOT Vmax:         45.50 cm/s LVOT Vmean:        28.967 cm/s LVOT VTI:          0.058 m LVOT/AV VTI ratio: 0.22  AORTA Ao Root  diam: 3.20 cm Ao Asc diam:  3.20 cm MITRAL VALVE                TRICUSPID VALVE MV Area (PHT): 6.54 cm     TR Peak grad:   23.2 mmHg MV Decel Time: 116 msec     TR Vmax:        241.00 cm/s MR Peak grad: 55.1 mmHg MR Vmax:      371.00 cm/s   SHUNTS MV E velocity: 101.00 cm/s  Systemic VTI:  0.06 m  Systemic Diam: 2.10 cm Riley Lam MD Electronically signed by Riley Lam MD Signature Date/Time: 12/21/2023/3:46:56 PM    Final     Cardiac Studies    1. Left ventricular ejection fraction, by estimation, is 30 to 35%. The  left ventricle has moderately decreased function. The left ventricle has  no regional wall motion abnormalities. Left ventricular diastolic  parameters are indeterminate.   2. Right ventricular systolic function is mildly reduced. The right  ventricular size is moderately enlarged. There is normal pulmonary artery  systolic pressure.   3. Left atrial size was severely dilated.   4. Right atrial size was severely dilated.   5. The mitral valve is abnormal. Mild to moderate mitral valve  regurgitation. No evidence of mitral stenosis.   6. Tricuspid valve regurgitation is moderate.   7. The aortic valve is tricuspid. There is mild calcification of the  aortic valve. Aortic valve regurgitation is not visualized. Aortic valve  sclerosis/calcification is present, without any evidence of aortic  stenosis.   8. The inferior vena cava is normal in size with <50% respiratory  variability, suggesting right atrial pressure of 8 mmHg.   Patient Profile     67 y.o. female former with a hx of HTN, COPD (patient refutes; indicates related to chemical exposure), DM (A1c >6.5 in 2023), former tobacco use, coronary/aortic atherosclerosis, prior elevated troponin, LVH, mild AS who is being seen 12/22/2023 for the evaluation of newly recognized HFrEF and atrial fib at the request of Dr. [place].   Assessment & Plan     Acute respiratory failure  with hypoxia requiring O2:  Dropped her sats with ambulation.  Has previously had home O2 but not currently.  She wold likeby benefit from this.     HFrEF, new cardiomyopathy:  Likely secondary to atrial fib with RVR.   PlanTEE/DCCV in the AM.  Risks and benefits of transesophageal echocardiogram have been explained including risks of esophageal damage, perforation (1:10,000 risk), bleeding, pharyngeal hematoma as well as other potential complications associated with conscious sedation including aspiration, arrhythmia, respiratory failure and death. Alternatives to treatment were discussed, questions were answered. Patient is willing to proceed.   Continue Eliquis.  BP is low.  No further med titration today.  She is tolerating Lasix.  Continue IV today.     HTN:   BP is controlled.  Continue beta blocker for now. See above.   Newly recognized atrial fibrillation with RVR, duration unknown: TEE/DCCV as above.    Mildly elevated troponin:    Not suspecting acute coronary syndrome.  We will follow the EF as an out patient and consider ischemia work up if it does not improve with med titration and sinus rhythm.    Mild-moderate mitral regurgitation, moderate tricuspid regurgitation:  Follow up as above with repeat echo.   Mild AKI:  Creat up slightly this admission.  Follow with labs tomorrow.   DM:  Per primary team.    For questions or updates, please contact [facility] Please consult [email or web address] for contact info under Cardiology/STEMI.   Signed, Rollene Rotunda, MD  12/23/2023, 10:01 AM

## 2023-12-23 NOTE — Progress Notes (Signed)
Mobility Specialist Progress Note:    12/23/23 1154  Mobility  Activity Ambulated with assistance in hallway  Level of Assistance Standby assist, set-up cues, supervision of patient - no hands on  Assistive Device Other (Comment) (hand rails)  Distance Ambulated (ft) 350 ft  Activity Response Tolerated well  Mobility Referral Yes  Mobility visit 1 Mobility  Mobility Specialist Start Time (ACUTE ONLY) 0935  Mobility Specialist Stop Time (ACUTE ONLY) 0947  Mobility Specialist Time Calculation (min) (ACUTE ONLY) 12 min   Pt received in bed agreeable to mobility. No physical assistance needed during session. Towards EOS pts SPO2 dropped to 86%, no SOB. Left on 1L/min, SPO2 92%. Returned to room w/o fault. Call bell and personal belongings in reach. All needs met.  Thompson Grayer Mobility Specialist  Please contact vis Secure Chat or  Rehab Office [phone]

## 2023-12-23 NOTE — Progress Notes (Signed)
   Heart Failure Stewardship Pharmacist Progress Note   PCP: Patient, No Pcp Per PCP-Cardiologist: None    HPI:  67 yo F with PMH of HTN, T2DM, and COPD.  Presented to the ED on 12/20 with shortness of breath and LE edema for 1 week. Reports that she lost her job and insurance and was not taking medications for about 6 months. Found to be in afib RVR. CXR with cardiomegaly and pulmonary vascular congestion. ECHO on 12/21 with LVEF 30-35%, no RWMA, RV mildly reduced, mild to moderate MR.   Scheduled for TEE/DCCV on 12/24.  Denies shortness of breath or LE edema. Still on 1L oxygen. States she was not requiring supplemental O2 prior to admission but was once on 1L O2 when she was sleeping (when she still had active insurance). States that she is feeling lightheadedness and dizziness.   Current HF Medications: Diuretic: furosemide 40 mg IV BID Beta Blocker: metoprolol tartrate 75 mg BID  Prior to admission HF Medications: Beta blocker: metoprolol XL 50 mg daily (not taking)  Pertinent Lab Values: Serum creatinine 1.10, BUN 24, Potassium 4.3, Sodium 139, BNP 679.4, Magnesium 2.1, A1c 6.7   Vital Signs: Weight: 192 lbs (admission weight: 194 lbs) Blood pressure: 100-110/70s  Heart rate: 110s  I/O: net -2.5L yesterday; net -2.8L since admission  Medication Assistance / Insurance Benefits Check: Does the patient have prescription insurance?  No Type of insurance plan: part D starting 1/1  Outpatient Pharmacy:  Prior to admission outpatient pharmacy: [facility] Is the patient willing to use [facility] TOC pharmacy at discharge? Yes  Assessment: 1. Acute systolic CHF (LVEF 30-35%), due to presumed NICM, afib RVR. NYHA class III symptoms. - Continue furosemide 40 mg IV BID. Strict I/Os and daily weights. Keep K>4 and Mg>2. - Continue metoprolol tartrate 75 mg BID, will need to consolidate to metoprolol XL prior to discharge - Further GDMT after TEE/DCCV on 12/24   Plan: 1)  Medication changes recommended at this time: - None  2) Patient assistance: - Currentoly only has medical insurance with medicare - Part D prescription insurance is starting on 1/1  3)  Education  - Initial education complete - Full education to be completed prior to discharge  Sharen Hones, PharmD, BCPS Heart Failure Engineer, building services Phone [phone]

## 2023-12-23 NOTE — Progress Notes (Signed)
PHARMACY - ANTICOAGULATION CONSULT NOTE  Pharmacy Consult for lovenox > apixaban Indication: atrial fibrillation  No Known Allergies  Patient Measurements: Height: 5\' 9"  (175.3 cm) Weight: 87.1 kg (192 lb 0.3 oz) IBW/kg (Calculated) : 66.2  Vital Signs: Temp: 97.7 F (36.5 C) (12/23 0752) Temp Source: Oral (12/23 0752) BP: 109/64 (12/23 0752) Pulse Rate: 100 (12/23 0752)  Labs: Recent Labs    12/20/23 1302 12/20/23 1450 12/20/23 1934 12/21/23 0637 12/22/23 0256 12/23/23 0239  HGB 14.5  --   --  13.3 13.4 14.4  HCT 46.0  --   --  42.6 43.0 46.4*  PLT 191  --   --  190 199 196  HEPARINUNFRC  --   --  0.34 0.13*  --   --   CREATININE 0.73  --   --  1.05* 1.03* 1.10*  TROPONINIHS 28* 31*  --   --   --   --     Estimated Creatinine Clearance: 57.6 mL/min (A) (by C-G formula based on SCr of 1.1 mg/dL (H)).   Medical History: Past Medical History:  Diagnosis Date   COPD (chronic obstructive pulmonary disease) (HCC)    HTN (hypertension)     Medications:  See MAR   Assessment: 59 yof presented to the ED with ShOB and swollen legs, in Afib with RVR. To start IV heparin for afib. Baseline Hgb and platelets are WNL. She is not on anticoagulation PTA.  Pharmacy consulted for transition from lovenox to apixaban. With her age (67 yrs), Scr (1.03), and weight (88.4kg) she does not qualify for dose reduction.   Patient reports that urine is pink-tinged after first void of day but resolves after that. States that it happens after they flush her line in the morning. Will continue to monitor.    Goal of Therapy:  Monitor platelets by anticoagulation protocol: Yes  Plan:  Continue apixaban 5 mg twice daily Monitor CBC, signs/symptoms of bleeding Pharmacy will sign off but will continue to monitor peripherally   Lennie Muckle, PharmD PGY1 Pharmacy Resident  Please check AMION for all [facility] Pharmacy phone numbers After 10:00 PM, call Main Pharmacy [phone]  12/23/2023 9:07  AM

## 2023-12-23 NOTE — Care Management Important Message (Signed)
Important Message  Patient Details  Name: Natalie Alexander MRN: [id number] Date of Birth: 08/27/1956   Important Message Given:  Yes - Medicare IM     Renie Ora 12/23/2023, 10:03 AM

## 2023-12-24 ENCOUNTER — Encounter (HOSPITAL_COMMUNITY)
Admission: EM | Disposition: A | Discharge status: Home / Self Care | Payer: Self-pay | Source: Home / Self Care | Attending: Internal Medicine

## 2023-12-24 ENCOUNTER — Inpatient Hospital Stay (HOSPITAL_COMMUNITY): Payer: Medicare Other | Admitting: Anesthesiology

## 2023-12-24 ENCOUNTER — Inpatient Hospital Stay (HOSPITAL_COMMUNITY): Payer: Medicare Other

## 2023-12-24 ENCOUNTER — Encounter (HOSPITAL_COMMUNITY): Payer: Self-pay | Admitting: Cardiology

## 2023-12-24 DIAGNOSIS — I4891 Unspecified atrial fibrillation: Secondary | ICD-10-CM

## 2023-12-24 HISTORY — PX: TRANSESOPHAGEAL ECHOCARDIOGRAM (CATH LAB): EP1270

## 2023-12-24 HISTORY — PX: CARDIOVERSION: EP1203

## 2023-12-24 LAB — CBC
HCT: 48.9 % — ABNORMAL HIGH (ref 36.0–46.0)
Hemoglobin: 15.3 g/dL — ABNORMAL HIGH (ref 12.0–15.0)
MCH: 27.1 pg (ref 26.0–34.0)
MCHC: 31.3 g/dL (ref 30.0–36.0)
MCV: 86.7 fL (ref 80.0–100.0)
Platelets: 217 10*3/uL (ref 150–400)
RBC: 5.64 MIL/uL — ABNORMAL HIGH (ref 3.87–5.11)
RDW: 15.4 % (ref 11.5–15.5)
WBC: 7.9 10*3/uL (ref 4.0–10.5)
nRBC: 0 % (ref 0.0–0.2)

## 2023-12-24 LAB — BASIC METABOLIC PANEL
Anion gap: 11 (ref 5–15)
BUN: 28 mg/dL — ABNORMAL HIGH (ref 8–23)
CO2: 30 mmol/L (ref 22–32)
Calcium: 9.4 mg/dL (ref 8.9–10.3)
Chloride: 95 mmol/L — ABNORMAL LOW (ref 98–111)
Creatinine, Ser: 1.25 mg/dL — ABNORMAL HIGH (ref 0.44–1.00)
GFR, Estimated: 47 mL/min — ABNORMAL LOW (ref >60)
Glucose, Bld: 116 mg/dL — ABNORMAL HIGH (ref 70–99)
Potassium: 4.3 mmol/L (ref 3.5–5.1)
Sodium: 136 mmol/L (ref 135–145)

## 2023-12-24 LAB — ECHO TEE

## 2023-12-24 SURGERY — TRANSESOPHAGEAL ECHOCARDIOGRAM (TEE) (CATHLAB)
Anesthesia: Monitor Anesthesia Care

## 2023-12-24 MED ORDER — SODIUM CHLORIDE 0.9% FLUSH: 3.0000 mL | Freq: Two times a day (BID) | INTRAVENOUS | Status: DC

## 2023-12-24 MED ORDER — PROPOFOL 500 MG/50ML IV EMUL
INTRAVENOUS | Status: DC | PRN
  Administered 2023-12-24: 50 ug/kg/min via INTRAVENOUS

## 2023-12-24 MED ORDER — LIDOCAINE 2% (20 MG/ML) 5 ML SYRINGE
INTRAMUSCULAR | Status: DC | PRN
  Administered 2023-12-24: 100 mg via INTRAVENOUS

## 2023-12-24 MED ORDER — METOPROLOL TARTRATE 50 MG PO TABS
50.0000 mg | ORAL_TABLET | Freq: Two times a day (BID) | ORAL | Status: DC
  Administered 2023-12-24: 25 mg via ORAL
  Administered 2023-12-25 – 2023-12-26 (×3): 50 mg via ORAL
  Filled 2023-12-24 (×4): qty 1

## 2023-12-24 MED ORDER — PROPOFOL 10 MG/ML IV BOLUS
INTRAVENOUS | Status: DC | PRN
  Administered 2023-12-24: 40 mg via INTRAVENOUS

## 2023-12-24 MED ORDER — SODIUM CHLORIDE 0.9% FLUSH: 3.0000 mL | INTRAVENOUS | Status: DC | PRN

## 2023-12-24 MED ORDER — PHENYLEPHRINE 80 MCG/ML (10ML) SYRINGE FOR IV PUSH (FOR BLOOD PRESSURE SUPPORT)
PREFILLED_SYRINGE | INTRAVENOUS | Status: DC | PRN
  Administered 2023-12-24 (×2): 160 ug via INTRAVENOUS

## 2023-12-24 SURGICAL SUPPLY — 1 items: PAD DEFIB RADIO PHYSIO CONN (PAD) ×2 IMPLANT

## 2023-12-24 NOTE — Progress Notes (Signed)
Natalie Alexander  ZOX:[id number] DOB: May 27, 1956 DOA: 12/20/2023 PCP: Patient, No Pcp Per    Brief Narrative:  67 year old with a history of HTN and COPD who presented to the ER 12/20 with a week of progressively worsening shortness of breath and lower extremity edema.  She reported that she had recently lost her insurance and therefore had not been able to take her medications for at least 6 months.  In the ER she was found to be in atrial fibrillation with a heart rate of 162.  CXR was consistent with pulmonary edema.  CTa chest was negative for PE.  Goals of Care:   Code Status: Full Code   DVT prophylaxis:  apixaban (ELIQUIS) tablet 5 mg   Interim Hx: No acute events recorded overnight.  Afebrile.  Saturation 96% 2 L.  Creatinine climbing up to 1.25 today.  Underwent successful TEE DCCV today.  Alert and conversant with no deficits at the time of my evaluation.  Reports some dyspnea on exertion.  No chest pain.  Assessment & Plan:  Newly diagnosed atrial fibrillation with acute RVR Heart rate 162 at presentation - TSH within normal limits - Eliquis initiated - continue metoprolol - TEE DCCV 12/24 successful in converting her back to NSR -monitor mild hypotension post conversion  Newly diagnosed acute combined systolic and diastolic CHF TTE this admission noted EF 30-35% with grade 2 DD  - previous TTE January 2023 noted EF 60-65% -hold further diuresis today with climbing creatinine and modest hypotension- net negative approximately 5 L since admission  Filed Weights   12/22/23 0524 12/23/23 0641 12/24/23 0521  Weight: 88.4 kg 87.1 kg 85.9 kg     Pulmonary edema with acute hypoxic respiratory failure Oxygen saturation 88% on room air with tachypnea at presentation - due to pulmonary edema related to CHF -with creatinine climbing we may send rates the point of needing to stop diuresis -continue to ambulate with therapy and wean oxygen as able  HTN with hypertensive  urgency Blood pressure 180/104 at presentation -had been out of her home blood pressure medicines for months -blood pressure presently well-controlled and trending towards hypotension -adjust therapy  Coronary artery disease Calcification of coronary arteries noted on CT scanning - Cardiology suggest outpatient evaluation  HLD LDL 120 - atorvastatin initiated  Hypokalemia Corrected with supplementation  COPD Followed at [facility] - has been without her Stioloto inhaler for 6 months -maintenance inhaled medications resumed  Tobacco abuse Patient reports that she quit smoking 2 years ago  Family Communication: No family present at time of exam Disposition: Awaiting recommendations from PT/OT   Objective: Blood pressure 95/63, pulse 66, temperature 97.9 F (36.6 C), temperature source Oral, resp. rate 19, height 5\' 9"  (1.753 m), weight 85.9 kg, SpO2 94%.  Intake/Output Summary (Last 24 hours) at 12/24/2023 1354 Last data filed at 12/24/2023 0900 Gross per 24 hour  Intake 680 ml  Output 1900 ml  Net -1220 ml   Filed Weights   12/22/23 0524 12/23/23 0641 12/24/23 0521  Weight: 88.4 kg 87.1 kg 85.9 kg    Examination: General: No acute respiratory distress Lungs: Clear to auscultation today without wheezing Cardiovascular: RRR without murmur status post cardioversion Abdomen: Nontender, nondistended, soft, bowel sounds positive, no rebound Extremities: No significant edema bilateral lower extremities  CBC: Recent Labs  Lab 12/20/23 1302 12/21/23 0637 12/22/23 0256 12/23/23 0239 12/24/23 0246  WBC 7.6   < > 5.5 6.5 7.9  NEUTROABS 6.1  --   --   --   --  HGB 14.5   < > 13.4 14.4 15.3*  HCT 46.0   < > 43.0 46.4* 48.9*  MCV 85.5   < > 86.7 87.9 86.7  PLT 191   < > 199 196 217   < > = values in this interval not displayed.   Basic Metabolic Panel: Recent Labs  Lab 12/21/23 0637 12/22/23 0256 12/23/23 0239 12/24/23 0246  NA 141 139 139 136  K 3.3* 3.8  4.3 4.3  CL 104 104 103 95*  CO2 29 26 26 30   GLUCOSE 134* 122* 109* 116*  BUN 11 16 24* 28*  CREATININE 1.05* 1.03* 1.10* 1.25*  CALCIUM 9.0 9.3 9.3 9.4  MG 2.3 2.2 2.1  --    GFR: Estimated Creatinine Clearance: 50.4 mL/min (A) (by C-G formula based on SCr of 1.25 mg/dL (H)).   Scheduled Meds:  apixaban  5 mg Oral BID   arformoterol  15 mcg Nebulization BID   And   umeclidinium bromide  1 puff Inhalation Daily   atorvastatin  40 mg Oral Daily   guaiFENesin  1,200 mg Oral BID   metoprolol tartrate  75 mg Oral BID   sodium chloride flush  3 mL Intravenous Q12H      LOS: 4 days   Lonia Blood, MD [facility] Office  [phone] Pager - Text Page per Loretha Stapler  If 7PM-7AM, please contact night-coverage per Amion 12/24/2023, 1:54 PM

## 2023-12-24 NOTE — Transfer of Care (Signed)
Immediate Anesthesia Transfer of Care Note  Patient: Natalie Alexander  Procedure(s) Performed: TRANSESOPHAGEAL ECHOCARDIOGRAM CARDIOVERSION  Patient Location: Cath Lab holding  Anesthesia Type: MAC Level of Consciousness: awake, alert , and oriented  Airway & Oxygen Therapy: Patient Spontanous Breathing and Patient connected to nasal cannula oxygen  Post-op Assessment: Report given to RN and Post -op Vital signs reviewed and stable  Post vital signs: Reviewed and stable  Last Vitals:  Vitals Value Taken Time  BP    Temp    Pulse 63 12/24/23 0856  Resp 26 12/24/23 0856  SpO2 89 % 12/24/23 0856  Vitals shown include unfiled device data.  Last Pain:  Vitals:   12/24/23 0752  TempSrc: Temporal  PainSc:       Patients Stated Pain Goal: 2 (12/21/23 1900)  Complications: No notable events documented.

## 2023-12-24 NOTE — Progress Notes (Signed)
Pt HR low 60s NSR and BP: 98/66 (75). Starting on metoprolol tartrate 50mg  this evening. Dr. Regino Schultze w/ cardiology notified. Verbal order to give 25mg  for tonight's dose instead of 50.

## 2023-12-24 NOTE — Interval H&P Note (Signed)
History and Physical Interval Note:  12/24/2023 8:34 AM  Natalie Alexander  has presented today for surgery, with the diagnosis of afib.  The various methods of treatment have been discussed with the patient and family. After consideration of risks, benefits and other options for treatment, the patient has consented to  Procedure(s): TRANSESOPHAGEAL ECHOCARDIOGRAM (N/A) CARDIOVERSION (N/A) as a surgical intervention.  The patient's history has been reviewed, patient examined, no change in status, stable for surgery.  I have reviewed the patient's chart and labs.  Questions were answered to the patient's satisfaction.     Little Ishikawa

## 2023-12-24 NOTE — Progress Notes (Signed)
   Heart Failure Stewardship Pharmacist Progress Note   PCP: Patient, No Pcp Per PCP-Cardiologist: None    HPI:  67 yo F with PMH of HTN, T2DM, and COPD.  Presented to the ED on 12/20 with shortness of breath and LE edema for 1 week. Reports that she lost her job and insurance and was not taking medications for about 6 months. Found to be in afib RVR. CXR with cardiomegaly and pulmonary vascular congestion. ECHO on 12/21 with LVEF 30-35%, no RWMA, RV mildly reduced, mild to moderate MR.   S/p successful TEE/DCCV on 12/24.  Denies shortness of breath or LE edema. Now off oxygen. States she was not requiring supplemental O2 prior to admission but was once on 1L O2 when she was sleeping (when she still had active insurance).   Current HF Medications: Beta Blocker: metoprolol tartrate 75 mg BID  Prior to admission HF Medications: Beta blocker: metoprolol XL 50 mg daily (not taking)  Pertinent Lab Values: Serum creatinine 1.25, BUN 28, Potassium 4.3, Sodium 136, BNP 679.4, Magnesium 2.1, A1c 6.7   Vital Signs: Weight: 189 lbs (admission weight: 194 lbs) Blood pressure: 100/70s  Heart rate: 50-60s  I/O: net -1.1L yesterday; net -4L since admission  Medication Assistance / Insurance Benefits Check: Does the patient have prescription insurance?  No Type of insurance plan: part D starting 1/1  Outpatient Pharmacy:  Prior to admission outpatient pharmacy: [facility] Is the patient willing to use [facility] TOC pharmacy at discharge? Yes  Assessment: 1. Acute systolic CHF (LVEF 30-35%), due to presumed NICM, afib RVR. NYHA class II symptoms. - Off IV lasix. Plan to start PO tomorrow per cardiology. Strict I/Os and daily weights. Keep K>4 and Mg>2. - Continue metoprolol tartrate 75 mg BID, will need to consolidate to metoprolol XL prior to discharge - Consider starting Farxiga 10 mg daily tomorrow if creatinine improved   Plan: 1) Medication changes recommended at this time: -  Change to metoprolol XL prior to discharge - Add Farxiga 10 mg daily tomorrow if creatinine improved  2) Patient assistance: - Currentoly only has medical insurance with medicare - Part D prescription insurance is starting on 1/1  3)  Education  - Initial education complete - Full education to be completed prior to discharge  Sharen Hones, PharmD, BCPS Heart Failure Engineer, building services Phone [phone]

## 2023-12-24 NOTE — Evaluation (Signed)
Physical Therapy Evaluation Patient Details Name: Natalie Alexander MRN: [id number] DOB: 27-Feb-1956 Today's Date: 12/24/2023  History of Present Illness  Patient is 67 y.o. female presenting to ED on 12/20 from home with BLE edema x1 week and SOB, has been unable to take medications for months due to loss of insurance. Admitted for A Fib RVR and acute respiratory failure requiring O2. PT s/p cardioversion on 12/24/23. PMH includes COPD and HTN.   Clinical Impression  Natalie Alexander is 67 y.o. female admitted with above HPI and diagnosis. Patient is currently limited by functional impairments below (see PT problem list). Patient lives with family and is independent with no AD at baseline. Currently pt is able to complete bed mob at mod ind level, and transfers/gait with supervision/CGA ofor safety. Pt noted to desat to mid 80's on RA with ambulation and on 2L/min recovered to >95% with mobility. Patient will benefit from continued skilled PT interventions to address impairments and progress independence with mobility. Acute PT will follow and progress as able.         If plan is discharge home, recommend the following: Assistance with cooking/housework;Assist for transportation;Help with stairs or ramp for entrance   Can travel by private vehicle        Equipment Recommendations None recommended by PT  Recommendations for Other Services       Functional Status Assessment Patient has had a recent decline in their functional status and demonstrates the ability to make significant improvements in function in a reasonable and predictable amount of time.     Precautions / Restrictions Precautions Precautions: Fall Restrictions Weight Bearing Restrictions Per Provider Order: No      Mobility  Bed Mobility Overal bed mobility: Needs Assistance Bed Mobility: Supine to Sit, Sit to Supine     Supine to sit: Modified independent (Device/Increase time) Sit to supine: Modified  independent (Device/Increase time)   General bed mobility comments: use of bed features    Transfers Overall transfer level: Needs assistance Equipment used: None Transfers: Sit to/from Stand Sit to Stand: Supervision           General transfer comment: sup for sfaety, pt using hands    Ambulation/Gait Ambulation/Gait assistance: Contact guard assist, Supervision Gait Distance (Feet): 400 Feet Assistive device: None Gait Pattern/deviations: Step-through pattern, Decreased stride length, Shuffle, Drifts right/left Gait velocity: decr        Stairs            Wheelchair Mobility     Tilt Bed    Modified Rankin (Stroke Patients Only)       Balance Overall balance assessment: Mild deficits observed, not formally tested                                           Pertinent Vitals/Pain Pain Assessment Pain Assessment: No/denies pain    Home Living Family/patient expects to be discharged to:: Private residence Living Arrangements: Children Available Help at Discharge: Family;Available PRN/intermittently Type of Home: House Home Access: Level entry     Alternate Level Stairs-Number of Steps: 7+7 Home Layout: Two level;Able to live on main level with bedroom/bathroom;1/2 bath on main level (shower up stairs) Home Equipment: Shower seat Additional Comments: was 1L O2 baseline to sleep    Prior Function Prior Level of Function : Independent/Modified Independent  Extremity/Trunk Assessment   Upper Extremity Assessment Upper Extremity Assessment: Defer to OT evaluation    Lower Extremity Assessment Lower Extremity Assessment: Overall WFL for tasks assessed    Cervical / Trunk Assessment Cervical / Trunk Assessment: Kyphotic  Communication   Communication Communication: No apparent difficulties  Cognition Arousal: Alert Behavior During Therapy: WFL for tasks assessed/performed Overall Cognitive Status:  Within Functional Limits for tasks assessed                                          General Comments      Exercises     Assessment/Plan    PT Assessment Patient needs continued PT services  PT Problem List Decreased strength;Cardiopulmonary status limiting activity;Decreased activity tolerance;Decreased balance;Decreased mobility;Decreased knowledge of use of DME       PT Treatment Interventions DME instruction;Gait training;Stair training;Functional mobility training;Therapeutic activities;Therapeutic exercise;Balance training;Patient/family education    PT Goals (Current goals can be found in the Care Plan section)  Acute Rehab PT Goals Patient Stated Goal: recover and return home PT Goal Formulation: With patient Time For Goal Achievement: 01/07/24 Potential to Achieve Goals: Good    Frequency Min 1X/week     Co-evaluation               AM-PAC PT "6 Clicks" Mobility  Outcome Measure Help needed turning from your back to your side while in a flat bed without using bedrails?: None Help needed moving from lying on your back to sitting on the side of a flat bed without using bedrails?: None Help needed moving to and from a bed to a chair (including a wheelchair)?: A Little Help needed standing up from a chair using your arms (e.g., wheelchair or bedside chair)?: A Little Help needed to walk in hospital room?: A Little Help needed climbing 3-5 steps with a railing? : A Little 6 Click Score: 20    End of Session Equipment Utilized During Treatment: Gait belt;Oxygen Activity Tolerance: Patient tolerated treatment well Patient left: in bed;with call bell/phone within reach Nurse Communication: Mobility status PT Visit Diagnosis: Other abnormalities of gait and mobility (R26.89);Muscle weakness (generalized) (M62.81);Difficulty in walking, not elsewhere classified (R26.2)    Time: 2725-3664 PT Time Calculation (min) (ACUTE ONLY): 24  min   Charges:   PT Evaluation $PT Eval Low Complexity: 1 Low PT Treatments $Gait Training: 8-22 mins PT General Charges $$ ACUTE PT VISIT: 1 Visit         Wynn Maudlin, DPT Acute Rehabilitation Services Office [phone]  12/24/23 11:25 AM

## 2023-12-24 NOTE — Progress Notes (Signed)
SATURATION QUALIFICATIONS: (This note is used to comply with regulatory documentation for home oxygen)  Patient Saturations on Room Air at Rest = 93%  Patient Saturations on Room Air while Ambulating = 84%  Patient Saturations on 2 Liters of oxygen while Ambulating = 95%  Please briefly explain why patient needs home oxygen: Pt able to maintain adequate O2 saturations at rest on RA however with increased stress of mobility desaturates to mid 80's and becomes mildly SOB. 1L/min not adequate and SPO2 increased to 89% only, 2L/min allowed adequate SpO2 of 95%. Will continue to monitor.   Wynn Maudlin, DPT Acute Rehabilitation Services Office [phone]  12/24/23 10:51 AM

## 2023-12-24 NOTE — Progress Notes (Signed)
Progress Note  Patient Name: Natalie Alexander Date of Encounter: 12/24/2023  Primary Cardiologist:   None   Subjective   Breathing OK.  No chest pain.    Inpatient Medications    Scheduled Meds:  apixaban  5 mg Oral BID   arformoterol  15 mcg Nebulization BID   And   umeclidinium bromide  1 puff Inhalation Daily   atorvastatin  40 mg Oral Daily   furosemide  40 mg Intravenous Q12H   guaiFENesin  1,200 mg Oral BID   metoprolol tartrate  75 mg Oral BID   sodium chloride flush  3 mL Intravenous Q12H   Continuous Infusions:  PRN Meds: acetaminophen **OR** [DISCONTINUED] acetaminophen, hydrocortisone cream, metoprolol tartrate, ondansetron **OR** ondansetron (ZOFRAN) IV, oxyCODONE, polyethylene glycol   Vital Signs    Vitals:   12/24/23 0915 12/24/23 0920 12/24/23 0925 12/24/23 0927  BP: 102/64 91/69 (!) 97/44   Pulse: 64 68 66   Resp: (!) 25 16 20    Temp:    97.8 F (36.6 C)  TempSrc:    Temporal  SpO2: 91% 96% 94%   Weight:      Height:        Intake/Output Summary (Last 24 hours) at 12/24/2023 1031 Last data filed at 12/24/2023 0900 Gross per 24 hour  Intake 920 ml  Output 1900 ml  Net -980 ml   Filed Weights   12/22/23 0524 12/23/23 0641 12/24/23 0521  Weight: 88.4 kg 87.1 kg 85.9 kg    Telemetry    NSR - Personally Reviewed  ECG    NA - Personally Reviewed  Physical Exam   GEN: No acute distress.   Neck: No  JVD Cardiac: RRR, no murmurs, rubs, or gallops.  Respiratory:    Decreased breath sounds bilaterally.  No wheezing or crackles.  GI: Soft, nontender, non-distended  MS: No  edema; No deformity. Neuro:  Nonfocal  Psych: Normal affect   Labs    Chemistry Recent Labs  Lab 12/20/23 1302 12/21/23 0637 12/22/23 0256 12/23/23 0239 12/24/23 0246  NA 140   < > 139 139 136  K 3.7   < > 3.8 4.3 4.3  CL 103   < > 104 103 95*  CO2 26   < > 26 26 30   GLUCOSE 113*   < > 122* 109* 116*  BUN 12   < > 16 24* 28*  CREATININE 0.73   <  > 1.03* 1.10* 1.25*  CALCIUM 9.2   < > 9.3 9.3 9.4  PROT 7.7  --   --   --   --   ALBUMIN 4.3  --   --   --   --   AST 30  --   --   --   --   ALT 37  --   --   --   --   ALKPHOS 93  --   --   --   --   BILITOT 0.9  --   --   --   --   GFRNONAA >60   < > 59* 55* 47*  ANIONGAP 11   < > 9 10 11    < > = values in this interval not displayed.     Hematology Recent Labs  Lab 12/22/23 0256 12/23/23 0239 12/24/23 0246  WBC 5.5 6.5 7.9  RBC 4.96 5.28* 5.64*  HGB 13.4 14.4 15.3*  HCT 43.0 46.4* 48.9*  MCV 86.7 87.9 86.7  MCH 27.0 27.3  27.1  MCHC 31.2 31.0 31.3  RDW 15.9* 15.8* 15.4  PLT 199 196 217    Cardiac EnzymesNo results for input(s): "TROPONINI" in the last 168 hours. No results for input(s): "TROPIPOC" in the last 168 hours.   BNP Recent Labs  Lab 12/20/23 1302 12/23/23 0239  BNP 641.9* 679.4*     DDimer  Recent Labs  Lab 12/20/23 1302  DDIMER 0.62*     Radiology    EP STUDY Result Date: 12/24/2023 See surgical note for result.  US RENAL Result Date: 12/23/2023 CLINICAL DATA:  Left hydronephrosis. EXAM: RENAL / URINARY TRACT ULTRASOUND COMPLETE COMPARISON:  CT chest 12/20/2023 FINDINGS: Right Kidney: Renal measurements: 10.5 x 4 x 4.8 cm = volume: 104 mL. Echogenicity within normal limits. No mass or hydronephrosis visualized. Left Kidney: Renal measurements: 10 x 5.2 x 5.3 cm = volume: 143 mL. Echogenicity within normal limits. No mass or hydronephrosis visualized. Bladder: Bladder is decompressed.  No specific abnormality is demonstrated. Other: None. IMPRESSION: Normal ultrasound appearance of the kidneys. No evidence of hydronephrosis. Bladder is decompressed. Electronically Signed   By: Burman Nieves M.D.   On: 12/23/2023 20:32    Cardiac Studies   1. Left ventricular ejection fraction, by estimation, is 30 to 35%. The  left ventricle has moderately decreased function. The left ventricle has  no regional wall motion abnormalities. Left ventricular  diastolic  parameters are indeterminate.   2. Right ventricular systolic function is mildly reduced. The right  ventricular size is moderately enlarged. There is normal pulmonary artery  systolic pressure.   3. Left atrial size was severely dilated.   4. Right atrial size was severely dilated.   5. The mitral valve is abnormal. Mild to moderate mitral valve  regurgitation. No evidence of mitral stenosis.   6. Tricuspid valve regurgitation is moderate.   7. The aortic valve is tricuspid. There is mild calcification of the  aortic valve. Aortic valve regurgitation is not visualized. Aortic valve  sclerosis/calcification is present, without any evidence of aortic  stenosis.   8. The inferior vena cava is normal in size with <50% respiratory  variability, suggesting right atrial pressure of 8 mmHg.     Patient Profile     67 y.o. female iwth a hx of HTN, COPD (patient refutes; indicates related to chemical exposure), DM (A1c >6.5 in 2023), former tobacco use, coronary/aortic atherosclerosis, prior elevated troponin, LVH, mild AS who is being seen 12/22/2023 for the evaluation of newly recognized HFrEF and atrial fib at the request of Dr. [place].   Assessment & Plan    Acute respiratory failure with hypoxia requiring O2:    Will need home O2.    Acute systolic HF:  I/O net negative 4.0 liters.  Will hold diuretic with increasing creat.  Likely resume PO in the AM.  Not giving ARB/ACE/ARNi for now.    HTN:   BP is controlled.  Continue beta blocker for now. See above.    Newly recognized atrial fibrillation with RVR, duration unknown: TEE/DCCV as above.    Mildly elevated troponin:    Planning to follow EF as an out patient and consider ischemia work up if it does not improve with sinus rhythm and med titration.   Mild-moderate mitral regurgitation, moderate tricuspid regurgitation:  Follow up as above   Mild AKI:  Creat up slightly again today.  Follow.  Will hold Lasix.    DM:  Per  primary team.    For questions or updates, please  contact [facility] Please consult [email or web address] for contact info under Cardiology/STEMI.   Signed, Rollene Rotunda, MD  12/24/2023, 10:31 AM

## 2023-12-24 NOTE — CV Procedure (Addendum)
   TRANSESOPHAGEAL ECHOCARDIOGRAM GUIDED DIRECT CURRENT CARDIOVERSION  NAME:  Natalie Alexander   MRN: [id number] DOB:  November 10, 1956   ADMIT DATE: 12/20/2023  INDICATIONS: Symptomatic atrial fibrillation  PROCEDURE:   Informed consent was obtained prior to the procedure. The risks, benefits and alternatives for the procedure were discussed and the patient comprehended these risks.  Risks include, but are not limited to, cough, sore throat, vomiting, nausea, somnolence, esophageal and stomach trauma or perforation, bleeding, low blood pressure, aspiration, pneumonia, infection, trauma to the teeth and death.    After a procedural time-out, the oropharynx was anesthetized and the patient was sedated by the anesthesia service. The transesophageal probe was inserted in the esophagus and stomach without difficulty and multiple views were obtained. Anesthesia was monitored by Dr. Maple Hudson and Allyn Kenner, CRNA.   COMPLICATIONS:    Complications: No complications Patient tolerated procedure well.  FINDINGS:  No LAA thrombus. EF 30-35%   CARDIOVERSION:     Indications:  Symptomatic Atrial Fibrillation  Procedure Details:  Once the TEE was complete, the patient had the defibrillator pads placed in the anterior and posterior position. Once an appropriate level of sedation was confirmed, the patient was cardioverted x 1 with 200J of biphasic synchronized energy.  The patient converted to NSR.  There were no apparent complications.  The patient had normal neuro status and respiratory status post procedure with vitals stable as recorded elsewhere.  Adequate airway was maintained throughout and vital signs monitored per protocol.  Epifanio Lesches MD [facility]  [address] [place], [place] [address] [phone]   9:35 AM

## 2023-12-25 DIAGNOSIS — I4891 Unspecified atrial fibrillation: Secondary | ICD-10-CM | POA: Diagnosis not present

## 2023-12-25 DIAGNOSIS — I5033 Acute on chronic diastolic (congestive) heart failure: Secondary | ICD-10-CM | POA: Diagnosis not present

## 2023-12-25 LAB — CBC
HCT: 45.9 % (ref 36.0–46.0)
Hemoglobin: 14.3 g/dL (ref 12.0–15.0)
MCH: 26.9 pg (ref 26.0–34.0)
MCHC: 31.2 g/dL (ref 30.0–36.0)
MCV: 86.3 fL (ref 80.0–100.0)
Platelets: 202 10*3/uL (ref 150–400)
RBC: 5.32 MIL/uL — ABNORMAL HIGH (ref 3.87–5.11)
RDW: 15.5 % (ref 11.5–15.5)
WBC: 8.1 10*3/uL (ref 4.0–10.5)
nRBC: 0 % (ref 0.0–0.2)

## 2023-12-25 LAB — BASIC METABOLIC PANEL
Anion gap: 9 (ref 5–15)
BUN: 36 mg/dL — ABNORMAL HIGH (ref 8–23)
CO2: 29 mmol/L (ref 22–32)
Calcium: 9.2 mg/dL (ref 8.9–10.3)
Chloride: 96 mmol/L — ABNORMAL LOW (ref 98–111)
Creatinine, Ser: 1.3 mg/dL — ABNORMAL HIGH (ref 0.44–1.00)
GFR, Estimated: 45 mL/min — ABNORMAL LOW (ref >60)
Glucose, Bld: 129 mg/dL — ABNORMAL HIGH (ref 70–99)
Potassium: 3.9 mmol/L (ref 3.5–5.1)
Sodium: 134 mmol/L — ABNORMAL LOW (ref 135–145)

## 2023-12-25 LAB — MAGNESIUM: Magnesium: 2.3 mg/dL (ref 1.7–2.4)

## 2023-12-25 NOTE — Progress Notes (Signed)
Natalie Alexander  UXN:[id number] DOB: February 22, 1956 DOA: 12/20/2023 PCP: Patient, No Pcp Per    Brief Narrative:  67 year old with a history of HTN and COPD who presented to the ER 12/20 with a week of progressively worsening shortness of breath and lower extremity edema.  She reported that she had recently lost her insurance and therefore had not been able to take her medications for at least 6 months.  In the ER she was found to be in atrial fibrillation with a heart rate of 162.  CXR was consistent with pulmonary edema.  CTa chest was negative for PE.  Goals of Care:   Code Status: Full Code   DVT prophylaxis:  apixaban (ELIQUIS) tablet 5 mg   Interim Hx: Still on oxygen.  Ambulating fairly well.  Creatinine up again.  Bradycardic overnight.  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% with grade 2 diastolic dysfunction Creatinine up again today - Hold Lasix    Pulmonary edema with acute hypoxic respiratory failure Hypoxia persist - Supplemental oxygen  HTN with hypertensive urgency Blood pressure now controlled - Continue metoprolol  Coronary artery disease Based on CT calcifications - Outpatient follow-up  HLD -Continue atorvastatin, new  Hypokalemia Resolved  COPD -Continue LABA/LAMA  Tobacco abuse Patient reports that she quit smoking 2 years ago  Family Communication: None Disposition: None needed   Objective: Blood pressure 122/67, pulse 62, temperature 97.6 F (36.4 C), temperature source Oral, resp. rate 18, height 5\' 9"  (1.753 m), weight 86 kg, SpO2 98%.  Intake/Output Summary (Last 24 hours) at 12/25/2023 1403 Last data filed at 12/25/2023 1358 Gross per 24 hour  Intake 597 ml  Output 400 ml  Net 197 ml   Filed Weights   12/23/23 0641 12/24/23 0521 12/25/23 0515  Weight:  87.1 kg 85.9 kg 86 kg    Examination: Elderly adult female, sitting up in recliner, interactive and appropriate RRR, no murmurs, no peripheral edema, no JVD Respiratory rate normal, lungs clear without rales or wheezes Abdomen soft without tenderness palpation Attention normal, affect normal, judgment insight appear normal    CBC: Recent Labs  Lab 12/20/23 1302 12/21/23 0637 12/23/23 0239 12/24/23 0246 12/25/23 0252  WBC 7.6   < > 6.5 7.9 8.1  NEUTROABS 6.1  --   --   --   --   HGB 14.5   < > 14.4 15.3* 14.3  HCT 46.0   < > 46.4* 48.9* 45.9  MCV 85.5   < > 87.9 86.7 86.3  PLT 191   < > 196 217 202   < > = values in this interval not displayed.   Basic Metabolic Panel: Recent Labs  Lab 12/22/23 0256 12/23/23 0239 12/24/23 0246 12/25/23 0252  NA 139 139 136 134*  K 3.8 4.3 4.3 3.9  CL 104 103 95* 96*  CO2 26 26 30 29   GLUCOSE 122* 109* 116* 129*  BUN 16 24* 28* 36*  CREATININE 1.03* 1.10* 1.25* 1.30*  CALCIUM 9.3 9.3 9.4 9.2  MG 2.2 2.1  --  2.3   GFR: Estimated Creatinine Clearance: 48.5 mL/min (A) (by C-G formula based on SCr of 1.3 mg/dL (H)).   Scheduled Meds:  apixaban  5 mg Oral BID   arformoterol  15 mcg Nebulization BID   And   umeclidinium bromide  1 puff Inhalation Daily  atorvastatin  40 mg Oral Daily   guaiFENesin  1,200 mg Oral BID   metoprolol tartrate  50 mg Oral BID   sodium chloride flush  3 mL Intravenous Q12H      LOS: 5 days   Joen Laura, MD [facility] Office  [phone] Pager - Text Page per Loretha Stapler  If 7PM-7AM, please contact night-coverage per Amion 12/25/2023, 2:03 PM

## 2023-12-25 NOTE — Plan of Care (Signed)

## 2023-12-25 NOTE — Progress Notes (Signed)
Progress Note  Patient Name: Natalie Alexander Date of Encounter: 12/25/2023  Primary Cardiologist:   None   Subjective   Breathing OK.  No chest pain.    Inpatient Medications    Scheduled Meds:  apixaban  5 mg Oral BID   arformoterol  15 mcg Nebulization BID   And   umeclidinium bromide  1 puff Inhalation Daily   atorvastatin  40 mg Oral Daily   guaiFENesin  1,200 mg Oral BID   metoprolol tartrate  50 mg Oral BID   sodium chloride flush  3 mL Intravenous Q12H   Continuous Infusions:  PRN Meds: acetaminophen **OR** [DISCONTINUED] acetaminophen, hydrocortisone cream, metoprolol tartrate, ondansetron **OR** ondansetron (ZOFRAN) IV, oxyCODONE, polyethylene glycol   Vital Signs    Vitals:   12/24/23 1927 12/24/23 2155 12/25/23 0036 12/25/23 0515  BP: 98/66 98/66 112/73 115/64  Pulse:  65 63 61  Resp: 18  18 18   Temp: 99.3 F (37.4 C)  97.8 F (36.6 C) 97.6 F (36.4 C)  TempSrc: Oral  Oral Oral  SpO2:   96% 97%  Weight:    86 kg  Height:        Intake/Output Summary (Last 24 hours) at 12/25/2023 0751 Last data filed at 12/25/2023 0041 Gross per 24 hour  Intake 120 ml  Output 400 ml  Net -280 ml   Filed Weights   12/23/23 0641 12/24/23 0521 12/25/23 0515  Weight: 87.1 kg 85.9 kg 86 kg    Telemetry    NSR, no recurrent Afib- Personally Reviewed  ECG    NA - Personally Reviewed  Physical Exam   GEN: No acute distress.   Neck: No  JVD Cardiac: RRR, no murmurs, rubs, or gallops.  Respiratory:    Decreased breath sounds bilaterally.  No wheezing or crackles.  GI: Soft, nontender, non-distended  MS: No  edema; No deformity. Neuro:  Nonfocal  Psych: Normal affect   Labs    Chemistry Recent Labs  Lab 12/20/23 1302 12/21/23 0637 12/23/23 0239 12/24/23 0246 12/25/23 0252  NA 140   < > 139 136 134*  K 3.7   < > 4.3 4.3 3.9  CL 103   < > 103 95* 96*  CO2 26   < > 26 30 29   GLUCOSE 113*   < > 109* 116* 129*  BUN 12   < > 24* 28* 36*   CREATININE 0.73   < > 1.10* 1.25* 1.30*  CALCIUM 9.2   < > 9.3 9.4 9.2  PROT 7.7  --   --   --   --   ALBUMIN 4.3  --   --   --   --   AST 30  --   --   --   --   ALT 37  --   --   --   --   ALKPHOS 93  --   --   --   --   BILITOT 0.9  --   --   --   --   GFRNONAA >60   < > 55* 47* 45*  ANIONGAP 11   < > 10 11 9    < > = values in this interval not displayed.     Hematology Recent Labs  Lab 12/23/23 0239 12/24/23 0246 12/25/23 0252  WBC 6.5 7.9 8.1  RBC 5.28* 5.64* 5.32*  HGB 14.4 15.3* 14.3  HCT 46.4* 48.9* 45.9  MCV 87.9 86.7 86.3  MCH 27.3 27.1 26.9  MCHC 31.0 31.3 31.2  RDW 15.8* 15.4 15.5  PLT 196 217 202    Cardiac EnzymesNo results for input(s): "TROPONINI" in the last 168 hours. No results for input(s): "TROPIPOC" in the last 168 hours.   BNP Recent Labs  Lab 12/20/23 1302 12/23/23 0239  BNP 641.9* 679.4*     DDimer  Recent Labs  Lab 12/20/23 1302  DDIMER 0.62*     Radiology    ECHO TEE Result Date: 12/24/2023    TRANSESOPHOGEAL ECHO REPORT   Patient Name:   DARIN SAUVE Date of Exam: 12/24/2023 Medical Rec #:  [id number]         Height:       69.0 in Accession #:    [id number]        Weight:       189.4 lb Date of Birth:  Jun 22, 1956        BSA:          2.019 m Patient Age:    67 years          BP:           122/101 mmHg Patient Gender: F                 HR:           131 bpm. Exam Location:  Inpatient Procedure: Transesophageal Echo, Color Doppler and Cardiac Doppler Indications:     I48.91* Unspecified atrial fibrillation  History:         Patient has prior history of Echocardiogram examinations, most                  recent 12/21/2023. CHF, COPD; Risk Factors:Hypertension.  Sonographer:     Irving Burton Senior RDCS Referring Phys:  [id number] Roe Rutherford [facility] Diagnosing Phys: Epifanio Lesches MD PROCEDURE: After discussion of the risks and benefits of a TEE, an informed consent was obtained from the patient. The transesophogeal probe was passed without  difficulty through the esophogus of the patient. Sedation performed by different physician. The patient was monitored while under deep sedation. Anesthestetic sedation was provided intravenously by Anesthesiology: 104mg  of Propofol, 100mg  of Lidocaine. The patient developed no complications during the procedure. A successful direct current cardioversion was performed at 200 joules with 1 attempt.  IMPRESSIONS  1. Left ventricular ejection fraction, by estimation, is 30 to 35%. The left ventricle has moderately decreased function.  2. Right ventricular systolic function is moderately reduced. The right ventricular size is normal.  3. Left atrial size was severely dilated. No left atrial/left atrial appendage thrombus was detected.  4. Right atrial size was severely dilated.  5. The mitral valve is normal in structure. Mild mitral valve regurgitation.  6. The aortic valve is tricuspid. Aortic valve regurgitation is not visualized. Conclusion(s)/Recommendation(s): No LA/LAA thrombus identified. Successful cardioversion performed with restoration of normal sinus rhythm. FINDINGS  Left Ventricle: Left ventricular ejection fraction, by estimation, is 30 to 35%. The left ventricle has moderately decreased function. The left ventricular internal cavity size was normal in size. Right Ventricle: The right ventricular size is normal. No increase in right ventricular wall thickness. Right ventricular systolic function is moderately reduced. Left Atrium: Left atrial size was severely dilated. No left atrial/left atrial appendage thrombus was detected. Right Atrium: Right atrial size was severely dilated. Pericardium: There is no evidence of pericardial effusion. Mitral Valve: The mitral valve is normal in structure. Mild mitral valve regurgitation. Tricuspid Valve: The tricuspid valve is normal in structure. Tricuspid  valve regurgitation is trivial. Aortic Valve: The aortic valve is tricuspid. Aortic valve regurgitation is not  visualized. Pulmonic Valve: The pulmonic valve was not well visualized. Pulmonic valve regurgitation is trivial. Aorta: The aortic root is normal in size and structure. IAS/Shunts: No atrial level shunt detected by color flow Doppler. Additional Comments: Spectral Doppler performed. Epifanio Lesches MD Electronically signed by Epifanio Lesches MD Signature Date/Time: 12/24/2023/12:20:56 PM    Final    EP STUDY Result Date: 12/24/2023 See surgical note for result.  US RENAL Result Date: 12/23/2023 CLINICAL DATA:  Left hydronephrosis. EXAM: RENAL / URINARY TRACT ULTRASOUND COMPLETE COMPARISON:  CT chest 12/20/2023 FINDINGS: Right Kidney: Renal measurements: 10.5 x 4 x 4.8 cm = volume: 104 mL. Echogenicity within normal limits. No mass or hydronephrosis visualized. Left Kidney: Renal measurements: 10 x 5.2 x 5.3 cm = volume: 143 mL. Echogenicity within normal limits. No mass or hydronephrosis visualized. Bladder: Bladder is decompressed.  No specific abnormality is demonstrated. Other: None. IMPRESSION: Normal ultrasound appearance of the kidneys. No evidence of hydronephrosis. Bladder is decompressed. Electronically Signed   By: Burman Nieves M.D.   On: 12/23/2023 20:32    Cardiac Studies   1. Left ventricular ejection fraction, by estimation, is 30 to 35%. The  left ventricle has moderately decreased function. The left ventricle has  no regional wall motion abnormalities. Left ventricular diastolic  parameters are indeterminate.   2. Right ventricular systolic function is mildly reduced. The right  ventricular size is moderately enlarged. There is normal pulmonary artery  systolic pressure.   3. Left atrial size was severely dilated.   4. Right atrial size was severely dilated.   5. The mitral valve is abnormal. Mild to moderate mitral valve  regurgitation. No evidence of mitral stenosis.   6. Tricuspid valve regurgitation is moderate.   7. The aortic valve is tricuspid. There is  mild calcification of the  aortic valve. Aortic valve regurgitation is not visualized. Aortic valve  sclerosis/calcification is present, without any evidence of aortic  stenosis.   8. The inferior vena cava is normal in size with <50% respiratory  variability, suggesting right atrial pressure of 8 mmHg.     Patient Profile     67 y.o. female iwth a hx of HTN, COPD (patient refutes; indicates related to chemical exposure), DM (A1c >6.5 in 2023), former tobacco use, coronary/aortic atherosclerosis, prior elevated troponin, LVH, mild AS who is being seen 12/22/2023 for the evaluation of newly recognized HFrEF and atrial fib at the request of Dr. [place].   Assessment & Plan    Acute respiratory failure with hypoxia requiring O2:    Will need home O2.    Acute systolic HF:  I/O net negative 4.3 liters.  On diuretics currently. Once renal function stable consider addition of ARNI/ARB  HTN:   BP is controlled.  Continue beta blocker for now. See above.    Newly recognized atrial fibrillation with RVR, duration unknown: TEE/DCCV yesterday. Maintaining NSR   Mildly elevated troponin:    Planning to follow EF as an out patient and consider ischemia work up if it does not improve with sinus rhythm and med titration.   Mild-moderate mitral regurgitation, moderate tricuspid regurgitation:  Follow up as above   Mild AKI:  Creat up slightly again today.  Follow.  Will hold Lasix.    DM:  Per primary team.    For questions or updates, please contact [facility] Please consult [email or web address] for contact info under Cardiology/STEMI.  Signed, Lenix Benoist [place], MD  12/25/2023, 7:51 AM

## 2023-12-25 NOTE — Plan of Care (Signed)
  Problem: Education: Goal: Knowledge of General Education information will improve Description: Including pain rating scale, medication(s)/side effects and non-pharmacologic comfort measures Outcome: Progressing   Problem: Clinical Measurements: Goal: Ability to maintain clinical measurements within normal limits will improve Outcome: Progressing Goal: Will remain free from infection Outcome: Progressing Goal: Diagnostic test results will improve Outcome: Progressing Goal: Respiratory complications will improve Outcome: Progressing Goal: Cardiovascular complication will be avoided Outcome: Progressing   Problem: Activity: Goal: Risk for activity intolerance will decrease Outcome: Progressing   Problem: Nutrition: Goal: Adequate nutrition will be maintained Outcome: Progressing   Problem: Elimination: Goal: Will not experience complications related to urinary retention Outcome: Progressing   Problem: Pain Management: Goal: General experience of comfort will improve Outcome: Progressing   Problem: Safety: Goal: Ability to remain free from injury will improve Outcome: Progressing

## 2023-12-26 ENCOUNTER — Other Ambulatory Visit (HOSPITAL_COMMUNITY): Payer: Self-pay

## 2023-12-26 DIAGNOSIS — I4891 Unspecified atrial fibrillation: Secondary | ICD-10-CM | POA: Diagnosis not present

## 2023-12-26 LAB — BASIC METABOLIC PANEL
Anion gap: 10 (ref 5–15)
BUN: 21 mg/dL (ref 8–23)
CO2: 29 mmol/L (ref 22–32)
Calcium: 9.2 mg/dL (ref 8.9–10.3)
Chloride: 99 mmol/L (ref 98–111)
Creatinine, Ser: 0.94 mg/dL (ref 0.44–1.00)
GFR, Estimated: >60 mL/min (ref >60)
Glucose, Bld: 114 mg/dL — ABNORMAL HIGH (ref 70–99)
Potassium: 4.1 mmol/L (ref 3.5–5.1)
Sodium: 138 mmol/L (ref 135–145)

## 2023-12-26 LAB — CBC
HCT: 46.1 % — ABNORMAL HIGH (ref 36.0–46.0)
Hemoglobin: 14.3 g/dL (ref 12.0–15.0)
MCH: 27 pg (ref 26.0–34.0)
MCHC: 31 g/dL (ref 30.0–36.0)
MCV: 87.1 fL (ref 80.0–100.0)
Platelets: 208 10*3/uL (ref 150–400)
RBC: 5.29 MIL/uL — ABNORMAL HIGH (ref 3.87–5.11)
RDW: 15.6 % — ABNORMAL HIGH (ref 11.5–15.5)
WBC: 7.1 10*3/uL (ref 4.0–10.5)
nRBC: 0 % (ref 0.0–0.2)

## 2023-12-26 MED ORDER — FUROSEMIDE 20 MG PO TABS
20.0000 mg | ORAL_TABLET | Freq: Every day | ORAL | 0 refills | Status: DC
  Filled 2023-12-26: qty 30, 30d supply, fill #0

## 2023-12-26 MED ORDER — POTASSIUM CHLORIDE CRYS ER 10 MEQ PO TBCR
10.0000 meq | EXTENDED_RELEASE_TABLET | Freq: Every day | ORAL | 0 refills | Status: DC
  Filled 2023-12-26: qty 30, 30d supply, fill #0

## 2023-12-26 MED ORDER — METOPROLOL TARTRATE 50 MG PO TABS
75.0000 mg | ORAL_TABLET | Freq: Two times a day (BID) | ORAL | 0 refills | Status: DC
  Filled 2023-12-26: qty 60, 20d supply, fill #0

## 2023-12-26 NOTE — Progress Notes (Signed)
Progress Note  Patient Name: Natalie Alexander Date of Encounter: 12/26/2023  Primary Cardiologist:   None   Subjective   Feeling better.   No acute complaints.   Inpatient Medications    Scheduled Meds:  apixaban  5 mg Oral BID   arformoterol  15 mcg Nebulization BID   And   umeclidinium bromide  1 puff Inhalation Daily   atorvastatin  40 mg Oral Daily   guaiFENesin  1,200 mg Oral BID   metoprolol tartrate  50 mg Oral BID   sodium chloride flush  3 mL Intravenous Q12H   Continuous Infusions:  PRN Meds: acetaminophen **OR** [DISCONTINUED] acetaminophen, hydrocortisone cream, metoprolol tartrate, ondansetron **OR** ondansetron (ZOFRAN) IV, oxyCODONE, polyethylene glycol   Vital Signs    Vitals:   12/25/23 1949 12/26/23 0105 12/26/23 0454 12/26/23 0751  BP: (!) 121/52 107/60 (!) 114/54   Pulse: 64 (!) 55 62 61  Resp: 18 18 18 18   Temp: 97.9 F (36.6 C) 97.8 F (36.6 C) 97.9 F (36.6 C)   TempSrc: Oral Oral Oral   SpO2: 98% 97% 98%   Weight:   87.7 kg   Height:        Intake/Output Summary (Last 24 hours) at 12/26/2023 0811 Last data filed at 12/26/2023 0454 Gross per 24 hour  Intake 834 ml  Output 1000 ml  Net -166 ml   Filed Weights   12/24/23 0521 12/25/23 0515 12/26/23 0454  Weight: 85.9 kg 86 kg 87.7 kg    Telemetry    NSR - Personally Reviewed  ECG    NA - Personally Reviewed  Physical Exam   GEN: No acute distress.   Neck: No  JVD Cardiac: RRR, no murmurs, rubs, or gallops.  Respiratory:     Decreased breath sounds GI: Soft, nontender, non-distended  MS: No  edema; No deformity. Neuro:  Nonfocal  Psych: Normal affect   Labs    Chemistry Recent Labs  Lab 12/20/23 1302 12/21/23 0637 12/24/23 0246 12/25/23 0252 12/26/23 0231  NA 140   < > 136 134* 138  K 3.7   < > 4.3 3.9 4.1  CL 103   < > 95* 96* 99  CO2 26   < > 30 29 29   GLUCOSE 113*   < > 116* 129* 114*  BUN 12   < > 28* 36* 21  CREATININE 0.73   < > 1.25* 1.30*  0.94  CALCIUM 9.2   < > 9.4 9.2 9.2  PROT 7.7  --   --   --   --   ALBUMIN 4.3  --   --   --   --   AST 30  --   --   --   --   ALT 37  --   --   --   --   ALKPHOS 93  --   --   --   --   BILITOT 0.9  --   --   --   --   GFRNONAA >60   < > 47* 45* >60  ANIONGAP 11   < > 11 9 10    < > = values in this interval not displayed.     Hematology Recent Labs  Lab 12/24/23 0246 12/25/23 0252 12/26/23 0231  WBC 7.9 8.1 7.1  RBC 5.64* 5.32* 5.29*  HGB 15.3* 14.3 14.3  HCT 48.9* 45.9 46.1*  MCV 86.7 86.3 87.1  MCH 27.1 26.9 27.0  MCHC 31.3 31.2 31.0  RDW 15.4 15.5 15.6*  PLT 217 202 208    Cardiac EnzymesNo results for input(s): "TROPONINI" in the last 168 hours. No results for input(s): "TROPIPOC" in the last 168 hours.   BNP Recent Labs  Lab 12/20/23 1302 12/23/23 0239  BNP 641.9* 679.4*     DDimer  Recent Labs  Lab 12/20/23 1302  DDIMER 0.62*     Radiology    ECHO TEE Result Date: 12/24/2023    TRANSESOPHOGEAL ECHO REPORT   Patient Name:   Natalie Alexander Date of Exam: 12/24/2023 Medical Rec #:  [id number]         Height:       69.0 in Accession #:    [id number]        Weight:       189.4 lb Date of Birth:  1956-09-01        BSA:          2.019 m Patient Age:    67 years          BP:           122/101 mmHg Patient Gender: F                 HR:           131 bpm. Exam Location:  Inpatient Procedure: Transesophageal Echo, Color Doppler and Cardiac Doppler Indications:     I48.91* Unspecified atrial fibrillation  History:         Patient has prior history of Echocardiogram examinations, most                  recent 12/21/2023. CHF, COPD; Risk Factors:Hypertension.  Sonographer:     Irving Burton Senior RDCS Referring Phys:  [id number] Roe Rutherford [facility] Diagnosing Phys: Epifanio Lesches MD PROCEDURE: After discussion of the risks and benefits of a TEE, an informed consent was obtained from the patient. The transesophogeal probe was passed without difficulty through the esophogus of  the patient. Sedation performed by different physician. The patient was monitored while under deep sedation. Anesthestetic sedation was provided intravenously by Anesthesiology: 104mg  of Propofol, 100mg  of Lidocaine. The patient developed no complications during the procedure. A successful direct current cardioversion was performed at 200 joules with 1 attempt.  IMPRESSIONS  1. Left ventricular ejection fraction, by estimation, is 30 to 35%. The left ventricle has moderately decreased function.  2. Right ventricular systolic function is moderately reduced. The right ventricular size is normal.  3. Left atrial size was severely dilated. No left atrial/left atrial appendage thrombus was detected.  4. Right atrial size was severely dilated.  5. The mitral valve is normal in structure. Mild mitral valve regurgitation.  6. The aortic valve is tricuspid. Aortic valve regurgitation is not visualized. Conclusion(s)/Recommendation(s): No LA/LAA thrombus identified. Successful cardioversion performed with restoration of normal sinus rhythm. FINDINGS  Left Ventricle: Left ventricular ejection fraction, by estimation, is 30 to 35%. The left ventricle has moderately decreased function. The left ventricular internal cavity size was normal in size. Right Ventricle: The right ventricular size is normal. No increase in right ventricular wall thickness. Right ventricular systolic function is moderately reduced. Left Atrium: Left atrial size was severely dilated. No left atrial/left atrial appendage thrombus was detected. Right Atrium: Right atrial size was severely dilated. Pericardium: There is no evidence of pericardial effusion. Mitral Valve: The mitral valve is normal in structure. Mild mitral valve regurgitation. Tricuspid Valve: The tricuspid valve is normal in structure. Tricuspid valve regurgitation is trivial. Aortic  Valve: The aortic valve is tricuspid. Aortic valve regurgitation is not visualized. Pulmonic Valve: The  pulmonic valve was not well visualized. Pulmonic valve regurgitation is trivial. Aorta: The aortic root is normal in size and structure. IAS/Shunts: No atrial level shunt detected by color flow Doppler. Additional Comments: Spectral Doppler performed. Epifanio Lesches MD Electronically signed by Epifanio Lesches MD Signature Date/Time: 12/24/2023/12:20:56 PM    Final    EP STUDY Result Date: 12/24/2023 See surgical note for result.   Cardiac Studies     1. Left ventricular ejection fraction, by estimation, is 30 to 35%. The  left ventricle has moderately decreased function. The left ventricle has  no regional wall motion abnormalities. Left ventricular diastolic  parameters are indeterminate.   2. Right ventricular systolic function is mildly reduced. The right  ventricular size is moderately enlarged. There is normal pulmonary artery  systolic pressure.   3. Left atrial size was severely dilated.   4. Right atrial size was severely dilated.   5. The mitral valve is abnormal. Mild to moderate mitral valve  regurgitation. No evidence of mitral stenosis.   6. Tricuspid valve regurgitation is moderate.   7. The aortic valve is tricuspid. There is mild calcification of the  aortic valve. Aortic valve regurgitation is not visualized. Aortic valve  sclerosis/calcification is present, without any evidence of aortic  stenosis.   8. The inferior vena cava is normal in size with <50% respiratory  variability, suggesting right atrial pressure of 8 mmHg.   Patient Profile     67 y.o. female with a hx of HTN, COPD (patient refutes; indicates related to chemical exposure), DM (A1c >6.5 in 2023), former tobacco use, coronary/aortic atherosclerosis, prior elevated troponin, LVH, mild AS who is being seen 12/22/2023 for the evaluation of newly recognized HFrEF and atrial fib at the request of Dr. [place].   Assessment & Plan    Acute respiratory failure with hypoxia requiring O2:    Home O2  per Dr. Maryfrances Bunnell.   Acute systolic HF:  I/O net negative 4.5 liters.  Discharge on 20 mg PO daily Lasix, fluid restrict and salt restrict.  I will consider further med titration as an out patient.      HTN:    This is being managed in the context of treating his CHF   Newly recognized atrial fibrillation with RVR, duration unknown: TEE/DCCV as above.  Continue Eliquis   Mildly elevated troponin:    Out patient ischemia work in the months to come pending med titration and repeat echo   Mild-moderate mitral regurgitation, moderate tricuspid regurgitation:  Follow clinically and with repeat echo.    Mild AKI:  Creat is back down.  See above.   DM:  Per primary team.      For questions or updates, please contact [facility] Please consult [email or web address] for contact info under Cardiology/STEMI.   Signed, Rollene Rotunda, MD  12/26/2023, 8:11 AM

## 2023-12-26 NOTE — Evaluation (Signed)
Occupational Therapy Evaluation Patient Details Name: Natalie Alexander MRN: [id number] DOB: Nov 01, 1956 Today's Date: 12/26/2023   History of Present Illness Patient is 67 y.o. female presenting to ED on 12/20 from home with BLE edema x1 week and SOB, has been unable to take medications for months due to loss of insurance. Admitted for A Fib RVR and acute respiratory failure requiring O2. PT s/p cardioversion on 12/24/23. PMH includes COPD and HTN.   Clinical Impression   Pt ambulating with overall supervision in hall but initially unsteady and needed CGA for safety, may benefit from intermittent SPC use at times, she was lightheaded but BP stable. Check ambulatory 02 notes for additional details. Educated pt on energy conservation strategies, she verbalized understanding. No post acute OT needs identified.        If plan is discharge home, recommend the following: A little help with walking and/or transfers;A little help with bathing/dressing/bathroom;Assistance with cooking/housework;Assist for transportation;Help with stairs or ramp for entrance    Functional Status Assessment  Patient has had a recent decline in their functional status and demonstrates the ability to make significant improvements in function in a reasonable and predictable amount of time.  Equipment Recommendations  Other (comment) (would benefit from intermittent SPC use)    Recommendations for Other Services       Precautions / Restrictions Precautions Precautions: Fall Precaution Comments: watch O2 Restrictions Weight Bearing Restrictions Per Provider Order: No      Mobility Bed Mobility Overal bed mobility: Modified Independent                  Transfers Overall transfer level: Needs assistance Equipment used: None Transfers: Sit to/from Stand Sit to Stand: Supervision                  Balance Overall balance assessment: Mild deficits observed, not formally tested                                          ADL either performed or assessed with clinical judgement   ADL                                       Functional mobility during ADLs: Supervision/safety General ADL Comments: focused session on OOB mobility and educated pt on energy conservation strategies.     Vision         Perception         Praxis         Pertinent Vitals/Pain Pain Assessment Pain Assessment: No/denies pain     Extremity/Trunk Assessment             Communication Communication Communication: No apparent difficulties   Cognition Arousal: Alert Behavior During Therapy: WFL for tasks assessed/performed Overall Cognitive Status: Within Functional Limits for tasks assessed                                       General Comments  Pt noted to be at 98% 02 on 1L, and stayed above 95% at rest when transitioned to RA. Check ambulatory 02 note but pt hitting to 91% at the lowest with hall mobility ~50ft    Exercises     Shoulder Instructions  Home Living                                          Prior Functioning/Environment                          OT Problem List: Decreased strength;Decreased range of motion;Decreased activity tolerance;Impaired balance (sitting and/or standing);Decreased safety awareness;Cardiopulmonary status limiting activity      OT Treatment/Interventions: Self-care/ADL training;Therapeutic exercise;Energy conservation;DME and/or AE instruction;Therapeutic activities;Patient/family education;Balance training    OT Goals(Current goals can be found in the care plan section) Acute Rehab OT Goals OT Goal Formulation: With patient Time For Goal Achievement: 01/06/24 Potential to Achieve Goals: Good  OT Frequency: Min 1X/week    Co-evaluation              AM-PAC OT "6 Clicks" Daily Activity     Outcome Measure Help from another person eating meals?: None Help  from another person taking care of personal grooming?: None Help from another person toileting, which includes using toliet, bedpan, or urinal?: A Little Help from another person bathing (including washing, rinsing, drying)?: A Little Help from another person to put on and taking off regular upper body clothing?: A Little Help from another person to put on and taking off regular lower body clothing?: A Little 6 Click Score: 20   End of Session Equipment Utilized During Treatment: Gait belt Nurse Communication: Mobility status  Activity Tolerance: Patient tolerated treatment well Patient left: in bed;with call bell/phone within reach  OT Visit Diagnosis: Unsteadiness on feet (R26.81);Other abnormalities of gait and mobility (R26.89);Muscle weakness (generalized) (M62.81)                Time: 7564-3329 OT Time Calculation (min): 12 min Charges:  OT General Charges $OT Visit: 1 Visit OT Treatments $Therapeutic Activity: 8-22 mins  12/26/2023  AB, OTR/L  Acute Rehabilitation Services  Office: [phone]   Tristan Schroeder 12/26/2023, 11:14 AM

## 2023-12-26 NOTE — Care Management Important Message (Signed)
Important Message  Patient Details  Name: Natalie Alexander MRN: [id number] Date of Birth: 1956-03-14   Important Message Given:  Yes - Medicare IM     Renie Ora 12/26/2023, 11:36 AM

## 2023-12-26 NOTE — TOC Transition Note (Signed)
Transition of Care [facility]) - Discharge Note   Patient Details  Name: Natalie Alexander MRN: [id number] Date of Birth: 02-04-1956  [facility] CM/SW Contact:  Kermit Balo, RN Phone Number: 12/26/2023, 11:18 AM   Clinical Narrative:     Pt is discharging home with self care. She is active with Dr Piedad Climes for PCP. CM was able to get hospital follow up appointment and placed it on AVS.  Pts medicare B wont go into effect until February. She will have to private pay at MD office until medicare B is active.  Pt did not qualify for home oxygen with mobility team. She only dropped to 91% on RA with ambulation. MD is aware.  Medications for discharge to be filled through [facility] pharmacy.  Pt has transportation home.  Final next level of care: Home/Self Care Barriers to Discharge: No Barriers Identified   Patient Goals and CMS Choice            Discharge Placement                       Discharge Plan and Services Additional resources added to the After Visit Summary for                                       Social Drivers of Health (SDOH) Interventions SDOH Screenings   Food Insecurity: No Food Insecurity (12/20/2023)  Housing: Low Risk  (12/23/2023)  Transportation Needs: No Transportation Needs (12/23/2023)  Utilities: Not At Risk (12/20/2023)  Alcohol Screen: Low Risk  (12/23/2023)  Financial Resource Strain: Low Risk  (12/23/2023)  Social Connections: Unknown (05/14/2022)   Received from [facility]  Tobacco Use: Medium Risk (12/23/2023)     Readmission Risk Interventions     No data to display

## 2023-12-26 NOTE — Discharge Summary (Signed)
Physician Discharge Summary   Patient: Natalie Alexander MRN: [id number] DOB: 1956/09/05  Admit date:     12/20/2023  Discharge date: 12/26/23  Discharge Physician: Alberteen Sam   PCP: Patient, No Pcp Per     Recommendations at discharge:  Follow up with new PCP when scheduled for HTN, COPD, CHF and AFIB Follow up with Cardiology (appointment scheduled) for new Afib, new cardiomyopathy Repeat BMP in 1 week  Continue oxygen for suspected chronic respiratory failure from CHF     Discharge Diagnoses: Principal Problem:   Atrial fibrillation with RVR (HCC) Active Problems:   Acute respiratory failure with hypoxia (HCC)   Hypertensive urgency   COPD (chronic obstructive pulmonary disease) (HCC)   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 F with HTN, COPD who presented with several days of worsening shortness of breath and lower extremity edema.  In the ER she was found to be in atrial fibrillation with rate 162.  Chest x-ray showed pulmonary edema.  She was admitted on rate control, Lasix.  Cardiology were consulted.     Newly diagnosed atrial fibrillation with RVR Status post cardioversion CHA2DS2-Vasc 5 (age, sex, CHF, HTN, vascular disease).  Started on Eliquis.  Underwent cardioversion.    Post cardioversion her rates were controlled in the high 50s low 60s with metoprolol 50 mg twice daily.   Newly diagnosed acute combined systolic and diastolic congestive heart failure During workup for atrial fibrillation, echocardiogram showed reduced EF 30 to 35%.  Cardiology were consulted.  They recommended holding ACE, ARB, Arni for now, outpatient follow-up for GDMT titration.  Discharged on Lasix 20 mg once daily, and metoprolol.   Hypertension with hypertensive urgency Patient had transient hypertensive urgency on admission, this resolved with diuresis and rate  control.  Coronary artery disease without angina Cardiology plan to reevaluate for ischemic disease as an outpatient pending reevaluation for EF after rhythm control  Hyperlipidemia Started on new Lipitor  Chronic obstructive pulmonary disease This appears to be stable.  She was discharged on new oxygen for presumed chronic respiratory failure in the setting of COPD and CHF.  Smoking Smoking cessation recommended, modalities discussed.  Type 2 diabetes, diet controlled Glucose easily controlled here without insulin.  Recommend she establish with a new PCP, which she plans to do.  TOC were asked to look into scheduling with new PCP                     The [facility] Controlled Substances Registry was reviewed for this patient prior to discharge.  Consultants: Cardiology  Procedures performed:  Echocardiogram TEE cardioversion  Disposition: Home Diet recommendation:  Discharge Diet Orders (From admission, onward)     Start     Ordered   12/26/23 0000  Diet - low sodium heart healthy        12/26/23 0941             DISCHARGE MEDICATION: Allergies as of 12/26/2023   No Known Allergies      Medication List     STOP taking these medications    metoprolol succinate 50 MG 24 hr tablet Commonly known as: TOPROL-XL       TAKE these medications    atorvastatin 40 MG tablet Commonly known as: LIPITOR Take 1 tablet (40 mg total) by mouth daily.   Eliquis 5 MG Tabs tablet Generic drug: apixaban Take  1 tablet (5 mg total) by mouth 2 (two) times daily.   furosemide 20 MG tablet Commonly known as: Lasix Take 1 tablet (20 mg total) by mouth daily.   metoprolol tartrate 50 MG tablet Commonly known as: LOPRESSOR Take 1.5 tablets (75 mg total) by mouth 2 (two) times daily.   potassium chloride 10 MEQ tablet Commonly known as: KLOR-CON M Take 1 tablet (10 mEq total) by mouth daily.   Stiolto Respimat 2.5-2.5 MCG/ACT Aers Generic drug:  Tiotropium Bromide-Olodaterol Inhale 2 puffs into the lungs daily.               Durable Medical Equipment  (From admission, onward)           Start     Ordered   12/26/23 0937  DME Oxygen  Once       Question Answer Comment  Length of Need Lifetime   Mode or (Route) Nasal cannula   Liters per Minute 2   Oxygen delivery system Gas      12/26/23 0941            Follow-up Information     [facility]. Go in 9 day(s).   Specialty: Cardiology Why: Hospital follow up 01/03/2024 @ 11 am PLEASE bring a current medication list to appointment FREE valet [parking, Entrance C, off [employer] information: [address] [place] [address] [phone]        [place], [place], [place] Follow up on 01/06/2024.   Specialty: Family Medicine Why: Your appointment is at 11:00 am. Please arrive early and bring your insurance cards. Contact information: [address] [place] [address] [phone]                 Discharge Instructions     Amb referral to AFIB Clinic   Complete by: As directed    Diet - low sodium heart healthy   Complete by: As directed    Discharge instructions   Complete by: As directed    **IMPORTANT DISCHARGE INSTRUCTIONS**   From Dr. Maryfrances Bunnell: You were admitted for trouble breathing.   Here, we found that you had fluid in your lungs ("pulmonary edema") that was caused by new atrial fibrillation and heart failure from the atrial fibrillation  Atrial fibrillation is an abnormal electrical rhythm in the heart that causes fast and irregular heart rates  You underwent CARDIOVERSION which is a procedure to shock the heart back into normal rhythm, and it appears to have worked well  Take metoprolol 50 mg twice daily to control the heart rate  Because atrial fibrillation causes blood to pool in the crevices of the heart, it can cause clots to form  there that can cause strokes For this reason, people take a blood thinner if they have had atrial fibrillation  Take apixaban/Eliquis 5 mg twice daily from now on  If you have blood in your bowel movements or black and sticky bowel movements, call your doctor right away do not wait  Go see the heart doctors (see below in the To Do section for the appointment time at the heart and vascular center)  They will adjust medicines for the heart failure to make sure this resolves  Lastly, take furosemide with potassium Furosemide helps keep fluid out of the lungs  Take your Stiolto inhaler daily for COPD (not heart related)  And lastly, to prevent further damage to the heart, lower your cholesterol with  atorvastatin/Lipitor, nightly  Use the oxygen all the time, whether sitting or walking, to keep your oxygen level at 88% or above  It is not for helping being out of breath, it is for keeping the oxygen level up  You should find a new general primary care doctor   Increase activity slowly   Complete by: As directed        Discharge Exam: Filed Weights   12/24/23 0521 12/25/23 0515 12/26/23 0454  Weight: 85.9 kg 86 kg 87.7 kg    General: Pt is alert, awake, not in acute distress Cardiovascular: RRR, nl S1-S2, no murmurs appreciated.   No LE edema.   Respiratory: Normal respiratory rate and rhythm.  CTAB without rales or wheezes. Abdominal: Abdomen soft and non-tender.  No distension or HSM.   Neuro/Psych: Strength symmetric in upper and lower extremities.  Judgment and insight appear normal.   Condition at discharge: good  The results of significant diagnostics from this hospitalization (including imaging, microbiology, ancillary and laboratory) are listed below for reference.   Imaging Studies: ECHO TEE Result Date: 12/24/2023    TRANSESOPHOGEAL ECHO REPORT   Patient Name:   Natalie Alexander Date of Exam: 12/24/2023 Medical Rec #:  [id number]         Height:       69.0 in  Accession #:    [id number]        Weight:       189.4 lb Date of Birth:  1956-03-04        BSA:          2.019 m Patient Age:    67 years          BP:           122/101 mmHg Patient Gender: F                 HR:           131 bpm. Exam Location:  Inpatient Procedure: Transesophageal Echo, Color Doppler and Cardiac Doppler Indications:     I48.91* Unspecified atrial fibrillation  History:         Patient has prior history of Echocardiogram examinations, most                  recent 12/21/2023. CHF, COPD; Risk Factors:Hypertension.  Sonographer:     Irving Burton Senior RDCS Referring Phys:  [id number] Roe Rutherford [facility] Diagnosing Phys: Epifanio Lesches MD PROCEDURE: After discussion of the risks and benefits of a TEE, an informed consent was obtained from the patient. The transesophogeal probe was passed without difficulty through the esophogus of the patient. Sedation performed by different physician. The patient was monitored while under deep sedation. Anesthestetic sedation was provided intravenously by Anesthesiology: 104mg  of Propofol, 100mg  of Lidocaine. The patient developed no complications during the procedure. A successful direct current cardioversion was performed at 200 joules with 1 attempt.  IMPRESSIONS  1. Left ventricular ejection fraction, by estimation, is 30 to 35%. The left ventricle has moderately decreased function.  2. Right ventricular systolic function is moderately reduced. The right ventricular size is normal.  3. Left atrial size was severely dilated. No left atrial/left atrial appendage thrombus was detected.  4. Right atrial size was severely dilated.  5. The mitral valve is normal in structure. Mild mitral valve regurgitation.  6. The aortic valve is tricuspid. Aortic valve regurgitation is not visualized. Conclusion(s)/Recommendation(s): No LA/LAA thrombus identified. Successful cardioversion performed with restoration of normal sinus rhythm. FINDINGS  Left Ventricle: Left ventricular  ejection fraction, by estimation, is 30 to 35%. The left ventricle has moderately decreased function. The left ventricular internal cavity size was normal in size. Right Ventricle: The right ventricular size is normal. No increase in right ventricular wall thickness. Right ventricular systolic function is moderately reduced. Left Atrium: Left atrial size was severely dilated. No left atrial/left atrial appendage thrombus was detected. Right Atrium: Right atrial size was severely dilated. Pericardium: There is no evidence of pericardial effusion. Mitral Valve: The mitral valve is normal in structure. Mild mitral valve regurgitation. Tricuspid Valve: The tricuspid valve is normal in structure. Tricuspid valve regurgitation is trivial. Aortic Valve: The aortic valve is tricuspid. Aortic valve regurgitation is not visualized. Pulmonic Valve: The pulmonic valve was not well visualized. Pulmonic valve regurgitation is trivial. Aorta: The aortic root is normal in size and structure. IAS/Shunts: No atrial level shunt detected by color flow Doppler. Additional Comments: Spectral Doppler performed. Epifanio Lesches MD Electronically signed by Epifanio Lesches MD Signature Date/Time: 12/24/2023/12:20:56 PM    Final    EP STUDY Result Date: 12/24/2023 See surgical note for result.  US RENAL Result Date: 12/23/2023 CLINICAL DATA:  Left hydronephrosis. EXAM: RENAL / URINARY TRACT ULTRASOUND COMPLETE COMPARISON:  CT chest 12/20/2023 FINDINGS: Right Kidney: Renal measurements: 10.5 x 4 x 4.8 cm = volume: 104 mL. Echogenicity within normal limits. No mass or hydronephrosis visualized. Left Kidney: Renal measurements: 10 x 5.2 x 5.3 cm = volume: 143 mL. Echogenicity within normal limits. No mass or hydronephrosis visualized. Bladder: Bladder is decompressed.  No specific abnormality is demonstrated. Other: None. IMPRESSION: Normal ultrasound appearance of the kidneys. No evidence of hydronephrosis. Bladder is  decompressed. Electronically Signed   By: Burman Nieves M.D.   On: 12/23/2023 20:32   ECHOCARDIOGRAM COMPLETE Result Date: 12/21/2023    ECHOCARDIOGRAM REPORT   Patient Name:   Natalie Alexander Date of Exam: 12/21/2023 Medical Rec #:  [id number]         Height:       69.0 in Accession #:    [id number]        Weight:       164.0 lb Date of Birth:  Oct 09, 1956        BSA:          1.899 m Patient Age:    67 years          BP:           114/101 mmHg Patient Gender: F                 HR:           90 bpm. Exam Location:  Inpatient Procedure: 2D Echo, Cardiac Doppler and Color Doppler Indications:    CHF-Acute Diastolic I50.31, Atrial Fibrillation I48.91  History:        Patient has prior history of Echocardiogram examinations, most                 recent 01/08/2022. CHF, COPD, Arrythmias:Atrial Fibrillation; Risk                 Factors:Hypertension.  Sonographer:    Lucendia Herrlich RCS Referring Phys: [id number] TIMOTHY S OPYD IMPRESSIONS  1. Left ventricular ejection fraction, by estimation, is 30 to 35%. The left ventricle has moderately decreased function. The left ventricle has no regional wall motion abnormalities. Left ventricular diastolic parameters are indeterminate.  2. Right ventricular systolic function is mildly reduced. The right ventricular size is  moderately enlarged. There is normal pulmonary artery systolic pressure.  3. Left atrial size was severely dilated.  4. Right atrial size was severely dilated.  5. The mitral valve is abnormal. Mild to moderate mitral valve regurgitation. No evidence of mitral stenosis.  6. Tricuspid valve regurgitation is moderate.  7. The aortic valve is tricuspid. There is mild calcification of the aortic valve. Aortic valve regurgitation is not visualized. Aortic valve sclerosis/calcification is present, without any evidence of aortic stenosis.  8. The inferior vena cava is normal in size with <50% respiratory variability, suggesting right atrial pressure of 8 mmHg.  Comparison(s): Prior images reviewed side by side. Ventricular function has decreased. FINDINGS  Left Ventricle: Left ventricular ejection fraction, by estimation, is 30 to 35%. The left ventricle has moderately decreased function. The left ventricle has no regional wall motion abnormalities. The left ventricular internal cavity size was normal in size. There is no left ventricular hypertrophy. Left ventricular diastolic parameters are indeterminate. Right Ventricle: The right ventricular size is moderately enlarged. No increase in right ventricular wall thickness. Right ventricular systolic function is mildly reduced. There is normal pulmonary artery systolic pressure. The tricuspid regurgitant velocity is 2.41 m/s, and with an assumed right atrial pressure of 8 mmHg, the estimated right ventricular systolic pressure is 31.2 mmHg. Left Atrium: Left atrial size was severely dilated. Right Atrium: Right atrial size was severely dilated. Pericardium: There is no evidence of pericardial effusion. Mitral Valve: The mitral valve is abnormal. Mild to moderate mitral valve regurgitation. No evidence of mitral valve stenosis. Tricuspid Valve: The tricuspid valve is normal in structure. Tricuspid valve regurgitation is moderate . No evidence of tricuspid stenosis. Aortic Valve: The aortic valve is tricuspid. There is mild calcification of the aortic valve. There is mild aortic valve annular calcification. Aortic valve regurgitation is not visualized. Aortic valve sclerosis/calcification is present, without any evidence of aortic stenosis. Aortic valve mean gradient measures 8.0 mmHg. Aortic valve peak gradient measures 14.3 mmHg. Aortic valve area, by VTI measures 0.77 cm. Pulmonic Valve: The pulmonic valve was not well visualized. Pulmonic valve regurgitation is mild. No evidence of pulmonic stenosis. Aorta: The aortic root, ascending aorta and aortic arch are all structurally normal, with no evidence of dilitation or  obstruction. Venous: The inferior vena cava is normal in size with less than 50% respiratory variability, suggesting right atrial pressure of 8 mmHg. IAS/Shunts: The atrial septum is grossly normal.  LEFT VENTRICLE PLAX 2D LVIDd:         5.50 cm   Diastology LVIDs:         3.50 cm   LV e' medial:    6.22 cm/s LV PW:         0.60 cm   LV E/e' medial:  16.2 LV IVS:        1.10 cm   LV e' lateral:   12.60 cm/s LVOT diam:     2.10 cm   LV E/e' lateral: 8.0 LV SV:         20 LV SV Index:   11 LVOT Area:     3.46 cm  RIGHT VENTRICLE            IVC RV S prime:     7.83 cm/s  IVC diam: 2.00 cm TAPSE (M-mode): 1.1 cm LEFT ATRIUM             Index        RIGHT ATRIUM  Index LA diam:        5.10 cm 2.69 cm/m   RA Area:     27.80 cm LA Vol (A2C):   99.0 ml 52.13 ml/m  RA Volume:   99.60 ml  52.45 ml/m LA Vol (A4C):   85.2 ml 44.87 ml/m LA Biplane Vol: 98.9 ml 52.08 ml/m  AORTIC VALVE AV Area (Vmax):    0.83 cm AV Area (Vmean):   0.80 cm AV Area (VTI):     0.77 cm AV Vmax:           189.00 cm/s AV Vmean:          125.000 cm/s AV VTI:            0.261 m AV Peak Grad:      14.3 mmHg AV Mean Grad:      8.0 mmHg LVOT Vmax:         45.50 cm/s LVOT Vmean:        28.967 cm/s LVOT VTI:          0.058 m LVOT/AV VTI ratio: 0.22  AORTA Ao Root diam: 3.20 cm Ao Asc diam:  3.20 cm MITRAL VALVE                TRICUSPID VALVE MV Area (PHT): 6.54 cm     TR Peak grad:   23.2 mmHg MV Decel Time: 116 msec     TR Vmax:        241.00 cm/s MR Peak grad: 55.1 mmHg MR Vmax:      371.00 cm/s   SHUNTS MV E velocity: 101.00 cm/s  Systemic VTI:  0.06 m                             Systemic Diam: 2.10 cm Riley Lam MD Electronically signed by Riley Lam MD Signature Date/Time: 12/21/2023/3:46:56 PM    Final    CT Angio Chest PE W and/or Wo Contrast Result Date: 12/20/2023 CLINICAL DATA:  Shortness of breath EXAM: CT ANGIOGRAPHY CHEST WITH CONTRAST TECHNIQUE: Multidetector CT imaging of the chest was performed using  the standard protocol during bolus administration of intravenous contrast. Multiplanar CT image reconstructions and MIPs were obtained to evaluate the vascular anatomy. RADIATION DOSE REDUCTION: This exam was performed according to the departmental dose-optimization program which includes automated exposure control, adjustment of the mA and/or kV according to patient size and/or use of iterative reconstruction technique. CONTRAST:  75mL OMNIPAQUE IOHEXOL 350 MG/ML SOLN COMPARISON:  12/20/2023, 01/08/2022 chest CT FINDINGS: Cardiovascular: Satisfactory opacification of the pulmonary arteries to the segmental level. No evidence of pulmonary embolism. Moderate aortic atherosclerosis. No aneurysm. Dilated pulmonary trunk up to 3.9 cm. Coronary vascular calcification. Cardiomegaly. No pericardial effusion. Mediastinum/Nodes: Patent trachea. No thyroid mass. Mild mediastinal lymph nodes. Right paratracheal nodes measuring up to 16 mm. This is unchanged compared with 2023. Esophagus within normal limits. Small right hilar nodes measuring up to 12 mm, also unchanged. Lungs/Pleura: Emphysema. No acute airspace disease, pleural effusion, or pneumothorax. Upper Abdomen: No acute finding. Incompletely visualized mild hydronephrosis left kidney. Small left kidney stone. Musculoskeletal: No acute or suspicious osseous abnormality. Review of the MIP images confirms the above findings. IMPRESSION: 1. Negative for acute pulmonary embolus. 2. Emphysema. No CT evidence for acute pulmonary abnormality. 3. Cardiomegaly. Dilated pulmonary trunk suggesting pulmonary arterial hypertension. 4. Incompletely visualized mild hydronephrosis left kidney. Small left kidney stone. 5. Aortic atherosclerosis. Aortic Atherosclerosis (ICD10-I70.0) and Emphysema (  ICD10-J43.9). Electronically Signed   By: Jasmine Pang M.D.   On: 12/20/2023 17:42   US Venous Img Lower Bilateral (DVT) Result Date: 12/20/2023 CLINICAL DATA:  Shortness of breath and  swelling EXAM: BILATERAL LOWER EXTREMITY VENOUS DOPPLER ULTRASOUND TECHNIQUE: Gray-scale sonography with graded compression, as well as color Doppler and duplex ultrasound were performed to evaluate the lower extremity deep venous systems from the level of the common femoral vein and including the common femoral, femoral, profunda femoral, popliteal and calf veins including the posterior tibial, peroneal and gastrocnemius veins when visible. The superficial great saphenous vein was also interrogated. Spectral Doppler was utilized to evaluate flow at rest and with distal augmentation maneuvers in the common femoral, femoral and popliteal veins. COMPARISON:  None Available. FINDINGS: RIGHT LOWER EXTREMITY Common Femoral Vein: No evidence of thrombus. Normal compressibility, respiratory phasicity and response to augmentation. Saphenofemoral Junction: No evidence of thrombus. Normal compressibility and flow on color Doppler imaging. Profunda Femoral Vein: No evidence of thrombus. Normal compressibility and flow on color Doppler imaging. Femoral Vein: No evidence of thrombus. Normal compressibility, respiratory phasicity and response to augmentation. Popliteal Vein: No evidence of thrombus. Normal compressibility, respiratory phasicity and response to augmentation. Calf Veins: No evidence of thrombus. Normal compressibility and flow on color Doppler imaging. Superficial Great Saphenous Vein: No evidence of thrombus. Normal compressibility. Venous Reflux:  None. Other Findings:  None. LEFT LOWER EXTREMITY Common Femoral Vein: No evidence of thrombus. Normal compressibility, respiratory phasicity and response to augmentation. Saphenofemoral Junction: No evidence of thrombus. Normal compressibility and flow on color Doppler imaging. Profunda Femoral Vein: No evidence of thrombus. Normal compressibility and flow on color Doppler imaging. Femoral Vein: No evidence of thrombus. Normal compressibility, respiratory phasicity and  response to augmentation. Popliteal Vein: No evidence of thrombus. Normal compressibility, respiratory phasicity and response to augmentation. Calf Veins: No evidence of thrombus. Normal compressibility and flow on color Doppler imaging. Superficial Great Saphenous Vein: No evidence of thrombus. Normal compressibility. Venous Reflux:  None. Other Findings:  Soft tissue edema identified along the ankle. IMPRESSION: No evidence of deep venous thrombosis in either lower extremity. Electronically Signed   By: Karen Kays M.D.   On: 12/20/2023 16:24   DG Chest Portable 1 View Result Date: 12/20/2023 CLINICAL DATA:  Shortness of breath. EXAM: PORTABLE CHEST 1 VIEW COMPARISON:  None Available. FINDINGS: The heart is enlarged. Aortic atherosclerosis. Bilateral interstitial prominence. No focal consolidation. No sizable pleural effusion or pneumothorax. No acute osseous abnormality. IMPRESSION: Cardiomegaly with findings suggestive of pulmonary vascular congestion. Electronically Signed   By: Hart Robinsons M.D.   On: 12/20/2023 14:55    Microbiology: Results for orders placed or performed during the hospital encounter of 12/20/23  Resp panel by RT-PCR (RSV, Flu A&B, Covid) Anterior Nasal Swab     Status: None   Collection Time: 12/20/23  1:02 PM   Specimen: Anterior Nasal Swab  Result Value Ref Range Status   SARS Coronavirus 2 by RT PCR NEGATIVE NEGATIVE Final    Comment: (NOTE) SARS-CoV-2 target nucleic acids are NOT DETECTED.  The SARS-CoV-2 RNA is generally detectable in upper respiratory specimens during the acute phase of infection. The lowest concentration of SARS-CoV-2 viral copies this assay can detect is 138 copies/mL. A negative result does not preclude SARS-Cov-2 infection and should not be used as the sole basis for treatment or other patient management decisions. A negative result may occur with  improper specimen collection/handling, submission of specimen other than nasopharyngeal  swab, presence of  viral mutation(s) within the areas targeted by this assay, and inadequate number of viral copies(<138 copies/mL). A negative result must be combined with clinical observations, patient history, and epidemiological information. The expected result is Negative.  Fact Sheet for Patients:  BloggerCourse.com  Fact Sheet for Healthcare Providers:  SeriousBroker.it  This test is no t yet approved or cleared by the [place] FDA and  has been authorized for detection and/or diagnosis of SARS-CoV-2 by FDA under an Emergency Use Authorization (EUA). This EUA will remain  in effect (meaning this test can be used) for the duration of the COVID-19 declaration under Section 564(b)(1) of the Act, 21 U.S.C.section 360bbb-3(b)(1), unless the authorization is terminated  or revoked sooner.       Influenza A by PCR NEGATIVE NEGATIVE Final   Influenza B by PCR NEGATIVE NEGATIVE Final    Comment: (NOTE) The Xpert Xpress SARS-CoV-2/FLU/RSV plus assay is intended as an aid in the diagnosis of influenza from Nasopharyngeal swab specimens and should not be used as a sole basis for treatment. Nasal washings and aspirates are unacceptable for Xpert Xpress SARS-CoV-2/FLU/RSV testing.  Fact Sheet for Patients: BloggerCourse.com  Fact Sheet for Healthcare Providers: SeriousBroker.it  This test is not yet approved or cleared by the [place] FDA and has been authorized for detection and/or diagnosis of SARS-CoV-2 by FDA under an Emergency Use Authorization (EUA). This EUA will remain in effect (meaning this test can be used) for the duration of the COVID-19 declaration under Section 564(b)(1) of the Act, 21 U.S.C. section 360bbb-3(b)(1), unless the authorization is terminated or revoked.     Resp Syncytial Virus by PCR NEGATIVE NEGATIVE Final    Comment: (NOTE) Fact Sheet for  Patients: BloggerCourse.com  Fact Sheet for Healthcare Providers: SeriousBroker.it  This test is not yet approved or cleared by the [place] FDA and has been authorized for detection and/or diagnosis of SARS-CoV-2 by FDA under an Emergency Use Authorization (EUA). This EUA will remain in effect (meaning this test can be used) for the duration of the COVID-19 declaration under Section 564(b)(1) of the Act, 21 U.S.C. section 360bbb-3(b)(1), unless the authorization is terminated or revoked.  Performed at [facility], [address], [place], [place] [address]     Labs: CBC: Recent Labs  Lab 12/20/23 1302 12/21/23 2952 12/22/23 0256 12/23/23 0239 12/24/23 0246 12/25/23 0252 12/26/23 0231  WBC 7.6   < > 5.5 6.5 7.9 8.1 7.1  NEUTROABS 6.1  --   --   --   --   --   --   HGB 14.5   < > 13.4 14.4 15.3* 14.3 14.3  HCT 46.0   < > 43.0 46.4* 48.9* 45.9 46.1*  MCV 85.5   < > 86.7 87.9 86.7 86.3 87.1  PLT 191   < > 199 196 217 202 208   < > = values in this interval not displayed.   Basic Metabolic Panel: Recent Labs  Lab 12/20/23 1302 12/21/23 0637 12/22/23 0256 12/23/23 0239 12/24/23 0246 12/25/23 0252 12/26/23 0231  NA 140 141 139 139 136 134* 138  K 3.7 3.3* 3.8 4.3 4.3 3.9 4.1  CL 103 104 104 103 95* 96* 99  CO2 26 29 26 26 30 29 29   GLUCOSE 113* 134* 122* 109* 116* 129* 114*  BUN 12 11 16  24* 28* 36* 21  CREATININE 0.73 1.05* 1.03* 1.10* 1.25* 1.30* 0.94  CALCIUM 9.2 9.0 9.3 9.3 9.4 9.2 9.2  MG 1.9 2.3 2.2 2.1  --  2.3  --    Liver Function Tests: Recent Labs  Lab 12/20/23 1302  AST 30  ALT 37  ALKPHOS 93  BILITOT 0.9  PROT 7.7  ALBUMIN 4.3   CBG: Recent Labs  Lab 12/20/23 1223  GLUCAP 130*    Discharge time spent: approximately 35 minutes spent on discharge counseling, evaluation of patient on day of discharge, and coordination of discharge planning with nursing, social  work, pharmacy and case management  Signed: Alberteen Sam, MD [facility] 12/26/2023

## 2023-12-26 NOTE — Progress Notes (Signed)
   Heart Failure Stewardship Pharmacist Progress Note   PCP: Patient, No Pcp Per PCP-Cardiologist: None    HPI:  67 yo F with PMH of HTN, T2DM, and COPD.  Presented to the ED on 12/20 with shortness of breath and LE edema for 1 week. Reports that she lost her job and insurance and was not taking medications for about 6 months. Found to be in afib RVR. CXR with cardiomegaly and pulmonary vascular congestion. ECHO on 12/21 with LVEF 30-35%, no RWMA, RV mildly reduced, mild to moderate MR.   S/p successful TEE/DCCV on 12/24. EF 30-35%.  Denies shortness of breath or LE edema. On 2L [place]. States she was not requiring supplemental O2 prior to admission but was once on 1L O2 when she was sleeping (when she still had active insurance).   Current HF Medications: Beta Blocker: metoprolol tartrate 50 mg BID  Prior to admission HF Medications: Beta blocker: metoprolol XL 50 mg daily (not taking)  Pertinent Lab Values: Serum creatinine 0.95, BUN 21, Potassium 4.1, Sodium 138, BNP 679.4, Magnesium 2.3, A1c 6.7   Vital Signs: Weight: 193 lbs (admission weight: 194 lbs) Blood pressure: 110-120/60s  Heart rate: 50-60s  I/O: net +0.1L yesterday; net -4.5L since admission  Medication Assistance / Insurance Benefits Check: Does the patient have prescription insurance?  No Type of insurance plan: part D starting 1/1  Outpatient Pharmacy:  Prior to admission outpatient pharmacy: [facility] Is the patient willing to use [facility] TOC pharmacy at discharge? Yes  Assessment: 1. Acute systolic CHF (LVEF 30-35%), due to presumed NICM, afib RVR. NYHA class II symptoms. - Off IV lasix. Strict I/Os and daily weights. Keep K>4 and Mg>2. - Continue metoprolol tartrate 50 mg BID, will need to consolidate to metoprolol XL prior to discharge - Consider starting Farxiga 10 mg daily today since creatinine improved   Plan: 1) Medication changes recommended at this time: - Change to metoprolol XL prior to  discharge - Add Farxiga 10 mg daily  2) Patient assistance: - Currentoly only has medical insurance with medicare - Part D prescription insurance is starting on 1/1  3)  Education  - Patient has been educated on current HF medications and potential additions to HF medication regimen - Patient verbalizes understanding that over the next few months, these medication doses may change and more medications may be added to optimize HF regimen - Patient has been educated on basic disease state pathophysiology and goals of therapy   Sharen Hones, PharmD, BCPS Heart Failure Engineer, building services Phone [phone]

## 2023-12-30 NOTE — Anesthesia Preprocedure Evaluation (Signed)
Anesthesia Evaluation  Patient identified by MRN, date of birth, ID band Patient awake    Reviewed: Allergy & Precautions, NPO status , Patient's Chart, lab work & pertinent test results  History of Anesthesia Complications Negative for: history of anesthetic complications  Airway Mallampati: III  TM Distance: >3 FB Neck ROM: Full    Dental  (+) Dental Advisory Given   Pulmonary shortness of breath, COPD, former smoker   breath sounds clear to auscultation       Cardiovascular hypertension, +CHF  + dysrhythmias Atrial Fibrillation  Rhythm:Irregular     Neuro/Psych    GI/Hepatic   Endo/Other    Renal/GU      Musculoskeletal   Abdominal   Peds  Hematology   Anesthesia Other Findings   Reproductive/Obstetrics                              Anesthesia Physical Anesthesia Plan  ASA: 2  Anesthesia Plan: General   Post-op Pain Management:    Induction:   PONV Risk Score and Plan: 2 and Treatment may vary due to age or medical condition  Airway Management Planned: Natural Airway, Simple Face Mask and Nasal Cannula  Additional Equipment: None  Intra-op Plan:   Post-operative Plan:   Informed Consent: I have reviewed the patients History and Physical, chart, labs and discussed the procedure including the risks, benefits and alternatives for the proposed anesthesia with the patient or authorized representative who has indicated his/her understanding and acceptance.     Dental advisory given  Plan Discussed with: CRNA  Anesthesia Plan Comments:          Anesthesia Quick Evaluation

## 2023-12-30 NOTE — Anesthesia Postprocedure Evaluation (Signed)
Anesthesia Post Note  Patient: Natalie Alexander  Procedure(s) Performed: TRANSESOPHAGEAL ECHOCARDIOGRAM CARDIOVERSION     Patient location during evaluation: Cath Lab Anesthesia Type: General Level of consciousness: awake and alert Pain management: pain level controlled Vital Signs Assessment: post-procedure vital signs reviewed and stable Respiratory status: spontaneous breathing, nonlabored ventilation and respiratory function stable Cardiovascular status: blood pressure returned to baseline and stable Postop Assessment: no apparent nausea or vomiting Anesthetic complications: no   No notable events documented.         Calum Cormier

## 2024-01-03 ENCOUNTER — Encounter (HOSPITAL_COMMUNITY): Payer: Self-pay

## 2024-01-03 ENCOUNTER — Other Ambulatory Visit (HOSPITAL_COMMUNITY): Payer: Self-pay

## 2024-01-03 ENCOUNTER — Ambulatory Visit (HOSPITAL_COMMUNITY)
Admit: 2024-01-03 | Discharge: 2024-01-03 | Disposition: A | Discharge status: Home / Self Care | Payer: 59 | Source: Ambulatory Visit | Attending: Adult Health | Admitting: Adult Health

## 2024-01-03 VITALS — Wt 192.0 lb

## 2024-01-03 DIAGNOSIS — I480 Paroxysmal atrial fibrillation: Secondary | ICD-10-CM | POA: Diagnosis not present

## 2024-01-03 DIAGNOSIS — I10 Essential (primary) hypertension: Secondary | ICD-10-CM

## 2024-01-03 DIAGNOSIS — Z7901 Long term (current) use of anticoagulants: Secondary | ICD-10-CM | POA: Diagnosis not present

## 2024-01-03 DIAGNOSIS — I4891 Unspecified atrial fibrillation: Secondary | ICD-10-CM

## 2024-01-03 DIAGNOSIS — R42 Dizziness and giddiness: Secondary | ICD-10-CM | POA: Insufficient documentation

## 2024-01-03 DIAGNOSIS — I110 Hypertensive heart disease with heart failure: Secondary | ICD-10-CM | POA: Insufficient documentation

## 2024-01-03 DIAGNOSIS — E785 Hyperlipidemia, unspecified: Secondary | ICD-10-CM | POA: Diagnosis not present

## 2024-01-03 DIAGNOSIS — Z79899 Other long term (current) drug therapy: Secondary | ICD-10-CM | POA: Insufficient documentation

## 2024-01-03 DIAGNOSIS — I5022 Chronic systolic (congestive) heart failure: Secondary | ICD-10-CM | POA: Insufficient documentation

## 2024-01-03 DIAGNOSIS — E7800 Pure hypercholesterolemia, unspecified: Secondary | ICD-10-CM

## 2024-01-03 DIAGNOSIS — J449 Chronic obstructive pulmonary disease, unspecified: Secondary | ICD-10-CM | POA: Insufficient documentation

## 2024-01-03 DIAGNOSIS — Z87891 Personal history of nicotine dependence: Secondary | ICD-10-CM | POA: Insufficient documentation

## 2024-01-03 DIAGNOSIS — I5033 Acute on chronic diastolic (congestive) heart failure: Secondary | ICD-10-CM

## 2024-01-03 LAB — BASIC METABOLIC PANEL
Anion gap: 13 (ref 5–15)
BUN: 15 mg/dL (ref 8–23)
CO2: 26 mmol/L (ref 22–32)
Calcium: 9.9 mg/dL (ref 8.9–10.3)
Chloride: 99 mmol/L (ref 98–111)
Creatinine, Ser: 0.85 mg/dL (ref 0.44–1.00)
GFR, Estimated: >60 mL/min (ref >60)
Glucose, Bld: 102 mg/dL — ABNORMAL HIGH (ref 70–99)
Potassium: 4 mmol/L (ref 3.5–5.1)
Sodium: 138 mmol/L (ref 135–145)

## 2024-01-03 MED ORDER — HYDRALAZINE HCL 25 MG PO TABS: 50.0000 mg | ORAL_TABLET | Freq: Once | ORAL | Status: DC

## 2024-01-03 MED ORDER — METOPROLOL SUCCINATE ER 50 MG PO TB24
50.0000 mg | ORAL_TABLET | Freq: Every day | ORAL | 3 refills | Status: DC
  Filled 2024-01-03: qty 90, 90d supply, fill #0
  Filled 2024-03-30: qty 90, 90d supply, fill #1
  Filled 2024-06-25: qty 90, 90d supply, fill #2
  Filled 2024-09-22: qty 90, 90d supply, fill #0

## 2024-01-03 MED ORDER — HYDRALAZINE HCL 50 MG PO TABS
50.0000 mg | ORAL_TABLET | Freq: Once | ORAL | Status: DC
  Administered 2024-01-03: 50 mg via ORAL

## 2024-01-03 MED ORDER — LOSARTAN POTASSIUM 25 MG PO TABS
12.5000 mg | ORAL_TABLET | Freq: Every day | ORAL | 3 refills | Status: DC
  Filled 2024-01-03: qty 45, 90d supply, fill #0

## 2024-01-03 NOTE — Patient Instructions (Signed)
 Stop Lopressor  after today's dose. Start Toprol  XL 50 mg daily tomorrow. Start Losartan  12.5 mg daily tomorrow. Lab today - will call you if abnormal. Return to Heart Failure Pharmacy Clinic in 3 weeks - see below. Return to see Dr. Zenaida in Heart Failure Clinic in 6 weeks - see below. Please call us  at [phone] if any questions or concerns prior to your next visit.
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
## 2024-01-09 ENCOUNTER — Other Ambulatory Visit (HOSPITAL_COMMUNITY): Payer: Self-pay
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
## 2024-01-16 ENCOUNTER — Other Ambulatory Visit (HOSPITAL_COMMUNITY): Payer: Self-pay

## 2024-01-21 ENCOUNTER — Other Ambulatory Visit (HOSPITAL_COMMUNITY): Payer: Self-pay

## 2024-01-21 ENCOUNTER — Ambulatory Visit (HOSPITAL_COMMUNITY)
Admission: RE | Admit: 2024-01-21 | Discharge: 2024-01-21 | Disposition: A | Discharge status: Home / Self Care | Payer: PRIVATE HEALTH INSURANCE | Source: Ambulatory Visit | Attending: Cardiology | Admitting: Cardiology

## 2024-01-21 ENCOUNTER — Telehealth (HOSPITAL_COMMUNITY): Payer: Self-pay | Admitting: Pharmacy Technician

## 2024-01-21 VITALS — BP 158/96 | HR 85 | Wt 190.2 lb

## 2024-01-21 DIAGNOSIS — E785 Hyperlipidemia, unspecified: Secondary | ICD-10-CM | POA: Diagnosis not present

## 2024-01-21 DIAGNOSIS — Z87891 Personal history of nicotine dependence: Secondary | ICD-10-CM | POA: Insufficient documentation

## 2024-01-21 DIAGNOSIS — Z79899 Other long term (current) drug therapy: Secondary | ICD-10-CM | POA: Insufficient documentation

## 2024-01-21 DIAGNOSIS — I480 Paroxysmal atrial fibrillation: Secondary | ICD-10-CM | POA: Insufficient documentation

## 2024-01-21 DIAGNOSIS — I5022 Chronic systolic (congestive) heart failure: Secondary | ICD-10-CM | POA: Diagnosis present

## 2024-01-21 DIAGNOSIS — Z7984 Long term (current) use of oral hypoglycemic drugs: Secondary | ICD-10-CM | POA: Diagnosis not present

## 2024-01-21 DIAGNOSIS — J449 Chronic obstructive pulmonary disease, unspecified: Secondary | ICD-10-CM | POA: Insufficient documentation

## 2024-01-21 DIAGNOSIS — Z7901 Long term (current) use of anticoagulants: Secondary | ICD-10-CM | POA: Insufficient documentation

## 2024-01-21 DIAGNOSIS — I110 Hypertensive heart disease with heart failure: Secondary | ICD-10-CM | POA: Insufficient documentation

## 2024-01-21 MED ORDER — FUROSEMIDE 20 MG PO TABS
20.0000 mg | ORAL_TABLET | Freq: Every day | ORAL | 3 refills | Status: AC | PRN
  Filled 2024-01-21: qty 30, 30d supply, fill #0
  Filled 2024-06-25: qty 30, 30d supply, fill #1

## 2024-01-21 MED ORDER — POTASSIUM CHLORIDE CRYS ER 10 MEQ PO TBCR
10.0000 meq | EXTENDED_RELEASE_TABLET | Freq: Every day | ORAL | 3 refills | Status: AC | PRN
  Filled 2024-01-21: qty 30, 30d supply, fill #0

## 2024-01-21 MED ORDER — ATORVASTATIN CALCIUM 40 MG PO TABS
40.0000 mg | ORAL_TABLET | Freq: Every day | ORAL | 5 refills | Status: DC
  Filled 2024-01-21: qty 30, 30d supply, fill #0
  Filled 2024-02-17: qty 30, 30d supply, fill #1
  Filled 2024-03-20: qty 30, 30d supply, fill #2
  Filled 2024-04-21: qty 30, 30d supply, fill #3
  Filled 2024-05-21: qty 30, 30d supply, fill #4
  Filled 2024-06-25: qty 30, 30d supply, fill #5

## 2024-01-21 MED ORDER — ENTRESTO 49-51 MG PO TABS
1.0000 | ORAL_TABLET | Freq: Two times a day (BID) | ORAL | 3 refills | Status: DC
  Filled 2024-01-21: qty 180, 90d supply, fill #0

## 2024-01-21 MED ORDER — APIXABAN 5 MG PO TABS
5.0000 mg | ORAL_TABLET | Freq: Two times a day (BID) | ORAL | 5 refills | Status: DC
  Filled 2024-01-21: qty 60, 30d supply, fill #0
  Filled 2024-02-17: qty 60, 30d supply, fill #1
  Filled 2024-03-20: qty 60, 30d supply, fill #2
  Filled 2024-04-21: qty 60, 30d supply, fill #3
  Filled 2024-05-21: qty 60, 30d supply, fill #4
  Filled 2024-06-17: qty 60, 30d supply, fill #5

## 2024-01-21 NOTE — Telephone Encounter (Signed)
Advanced Heart Failure Patient Advocate Encounter  The patient was approved for a [employer] grant that will help cover the cost of Entresto, Metoprolol. Total amount awarded, $10,000. Eligibility, 12/22/23 - 12/20/24.  ID [id number]  BIN [id number]  PCN [id number]  Group [id number]  Billing information added to WAM.  Archer Asa, CPhT

## 2024-01-21 NOTE — Patient Instructions (Addendum)
It was a pleasure seeing you today!  MEDICATIONS: -We are changing your medications today -Stop losartan  -Start Entresto 49/51 mg (1 tablet) twice daily -Stop taking furosemide (Lasix) every day. Start taking only when needed for extra fluid.  -Stop taking potassium supplements every day. Only take when you have used a dose of Lasix.  -Call if you have questions about your medications.   NEXT APPOINTMENT: Return to clinic in 3 weeks  with Dr. Elwyn Lade.  In general, to take care of your heart failure: -Limit your fluid intake to 2 Liters (half-gallon) per day.   -Limit your salt intake to ideally 2-3 grams (2000-3000 mg) per day. -Weigh yourself daily and record, and bring that "weight diary" to your next appointment.  (Weight gain of 2-3 pounds in 1 day typically means fluid weight.) -The medications for your heart are to help your heart and help you live longer.   -Please contact us before stopping any of your heart medications.  Call the clinic at [phone] with questions or to reschedule future appointments.

## 2024-02-07 ENCOUNTER — Telehealth (HOSPITAL_COMMUNITY): Payer: Self-pay | Admitting: Cardiology

## 2024-02-07 NOTE — Telephone Encounter (Signed)
 Called patient at [phone] to remind her of her appointment on Monday 02/10/24 at 10:40 AM with Dr. Zenaida.   Front office left patient a detailed message about appointment on her telephone - - front office also gave patient AHF clinic office telephone number if she has any questions or concerns.

## 2024-02-10 ENCOUNTER — Telehealth (HOSPITAL_COMMUNITY): Payer: Self-pay

## 2024-02-10 ENCOUNTER — Other Ambulatory Visit: Payer: Self-pay

## 2024-02-10 ENCOUNTER — Other Ambulatory Visit (HOSPITAL_BASED_OUTPATIENT_CLINIC_OR_DEPARTMENT_OTHER): Payer: Self-pay

## 2024-02-10 ENCOUNTER — Other Ambulatory Visit (HOSPITAL_COMMUNITY): Payer: Self-pay

## 2024-02-10 ENCOUNTER — Ambulatory Visit (HOSPITAL_COMMUNITY)
Admission: RE | Admit: 2024-02-10 | Discharge: 2024-02-10 | Disposition: A | Discharge status: Home / Self Care | Payer: Medicare Other | Source: Ambulatory Visit | Attending: Cardiology | Admitting: Cardiology

## 2024-02-10 ENCOUNTER — Encounter (HOSPITAL_COMMUNITY): Payer: Self-pay | Admitting: Cardiology

## 2024-02-10 VITALS — BP 140/80 | HR 96 | Wt 194.6 lb

## 2024-02-10 DIAGNOSIS — I4891 Unspecified atrial fibrillation: Secondary | ICD-10-CM

## 2024-02-10 DIAGNOSIS — I480 Paroxysmal atrial fibrillation: Secondary | ICD-10-CM | POA: Diagnosis not present

## 2024-02-10 DIAGNOSIS — I5022 Chronic systolic (congestive) heart failure: Secondary | ICD-10-CM | POA: Diagnosis not present

## 2024-02-10 DIAGNOSIS — Z7901 Long term (current) use of anticoagulants: Secondary | ICD-10-CM | POA: Diagnosis not present

## 2024-02-10 DIAGNOSIS — J449 Chronic obstructive pulmonary disease, unspecified: Secondary | ICD-10-CM | POA: Diagnosis not present

## 2024-02-10 DIAGNOSIS — Z87891 Personal history of nicotine dependence: Secondary | ICD-10-CM | POA: Insufficient documentation

## 2024-02-10 DIAGNOSIS — I110 Hypertensive heart disease with heart failure: Secondary | ICD-10-CM | POA: Diagnosis present

## 2024-02-10 DIAGNOSIS — I10 Essential (primary) hypertension: Secondary | ICD-10-CM | POA: Diagnosis not present

## 2024-02-10 DIAGNOSIS — Z79899 Other long term (current) drug therapy: Secondary | ICD-10-CM | POA: Insufficient documentation

## 2024-02-10 MED ORDER — EMPAGLIFLOZIN 10 MG PO TABS
10.0000 mg | ORAL_TABLET | Freq: Every day | ORAL | 5 refills | Status: DC
  Filled 2024-02-10 (×2): qty 30, 30d supply, fill #0
  Filled 2024-03-05: qty 30, 30d supply, fill #1
  Filled 2024-03-30: qty 30, 30d supply, fill #2
  Filled 2024-05-06: qty 30, 30d supply, fill #3
  Filled 2024-05-21 – 2024-06-04 (×2): qty 30, 30d supply, fill #4
  Filled 2024-07-02: qty 30, 30d supply, fill #5

## 2024-02-10 MED ORDER — SACUBITRIL-VALSARTAN 97-103 MG PO TABS
1.0000 | ORAL_TABLET | Freq: Two times a day (BID) | ORAL | 3 refills | Status: DC
  Filled 2024-02-10 (×2): qty 60, 30d supply, fill #0
  Filled 2024-03-06: qty 60, 30d supply, fill #1
  Filled 2024-03-30: qty 60, 30d supply, fill #2

## 2024-02-10 NOTE — Patient Instructions (Signed)
 Medication Changes:  START: JARDIANCE  10MG  ONCE DAILY   INCREASE ENTRESTO  TO 97/103MG  TWICE DAILY   Follow-Up in: 2 MONTHS WITH ECHO WITH DR. Alease Amend PLEASE CALL OUR OFFICE AROUND MARCH TO GET SCHEDULED FOR YOUR APPOINTMENT. PHONE NUMBER IS [phone] OPTION 2   At the Advanced Heart Failure Clinic, you and your health needs are our priority. We have a designated team specialized in the treatment of Heart Failure. This Care Team includes your primary Heart Failure Specialized Cardiologist (physician), Advanced Practice Providers (APPs- Physician Assistants and Nurse Practitioners), and Pharmacist who all work together to provide you with the care you need, when you need it.   You may see any of the following providers on your designated Care Team at your next follow up:  Dr. Jules Oar Dr. Peder Bourdon Dr. Alwin Baars Dr. Judyth Nunnery Nieves Bars, NP Ruddy Corral, [place] [facility] [place], [place] Dennise Fitz, NP [place] Lee, NP Luster Salters, PharmD   Please be sure to bring in all your medications bottles to every appointment.   Need to Contact Us :  If you have any questions or concerns before your next appointment please send us  a message through [place] or call our office at [phone].    TO LEAVE A MESSAGE FOR THE NURSE SELECT OPTION 2, PLEASE LEAVE A MESSAGE INCLUDING: YOUR NAME DATE OF BIRTH CALL BACK NUMBER REASON FOR CALL**this is important as we prioritize the call backs  YOU WILL RECEIVE A CALL BACK THE SAME DAY AS LONG AS YOU CALL BEFORE 4:00 PM

## 2024-02-10 NOTE — Telephone Encounter (Signed)
 Advanced Heart Failure Patient Advocate Encounter  Test billing for Jardiance  10 MG returns a refill too soon rejection. Test claims for 25 MG show $47 for 30 days, $141 for 90 days.  Patient was given a 30 day free card, and has a grant that will cover the copay of this medication going forward. (See alternate encounter for grant information and approval dates)  Stephaine H, CPhT Rx Patient Advocate Phone: [phone]

## 2024-02-10 NOTE — Progress Notes (Signed)
   ADVANCED HEART FAILURE NEW PATIENT CLINIC NOTE  Referring Physician: Burnis Medin, *  Primary Care: [place], [place], [place] Primary Cardiologist:  HPI: Natalie Alexander is a 68 y.o. female with a PMH of f PAF, HTN, COPD, former smoker for 50 years, quit 2023, and chronic HFrEF  who presents for initial visit for further evaluation and treatment of heart failure/cardiomyopathy.      Admitted 12/202/4 with A fib RVR and Acute HFrEF.  HIV NR,  TSH ok, HS 28>31. Echo EF 30-35%, RV mildly reduced, and no WMA.   Had TEE/DC-CV with restoration of SR.      SUBJECTIVE: Patient overall states that she is doing fairly well since discharge.  She has not had any recurrence of her lower extremity swelling, palpitations, orthopnea.  She does have some shortness of breath with exertion but that is improving.  Denies any low blood pressures and reports that her blood pressure mainly stays in the 140s.  She has been compliant with her medication.  She does have a remote history of UTI but nothing recent.  PMH, current medications, allergies, social history, and family history reviewed in epic.  PHYSICAL EXAM: Vitals:   02/10/24 1048  BP: (!) 140/80  Pulse: 96  SpO2: 95%   GENERAL: Well-appearing, clear tobacco use PULM: Normal work of breathing, CTAB CARDIAC:  JVP: Not elevated         Normal rate with regular rhythm. No murmurs, rubs or gallops.  No edema.  ABDOMEN: Soft, non-tender, non-distended. SKIN: Warm and dry; no lesions or wounds. Warm and well perfused extremities.  DATA REVIEW  ECG: 02/2024: Normal sinus rhythm  ECHO: 12/24/23 TEE and Cardioversion EF 30-35%. Converted to SR.  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  Appears euvolemic today, blood pressure elevated. -Increase Entresto 97/103 mg twice daily -Start Jardiance 10 mg daily -Taking Lasix every other day, instructed to only take as needed especially with starting Jardiance -Continue metoprolol succinate 50 mg daily -If EF persistently depressed at next visit add MRA -Repeat echocardiogram at next visit -Mild coronary calcifications noted on CT, if EF persistently depressed will need ischemic evaluation  Atrial fibrillation: Currently normal sinus rhythm. -Continue anticoagulation with Eliquis 5 mg twice daily -Consider EP referral for ablation if recurrent  COPD: Former smoker, on inhaler therapy. -Follow-up PCP  Hypertension: Uncontrolled in clinic today. -Adjustments as above    Clearnce Hasten, MD Advanced Heart Failure Mechanical Circulatory Support 02/10/24

## 2024-02-17 ENCOUNTER — Other Ambulatory Visit (HOSPITAL_COMMUNITY): Payer: Self-pay

## 2024-02-27 ENCOUNTER — Encounter: Payer: Self-pay | Admitting: Pulmonary Disease

## 2024-03-05 ENCOUNTER — Other Ambulatory Visit (HOSPITAL_COMMUNITY): Payer: Self-pay

## 2024-03-06 ENCOUNTER — Other Ambulatory Visit (HOSPITAL_COMMUNITY): Payer: Self-pay

## 2024-03-20 ENCOUNTER — Other Ambulatory Visit (HOSPITAL_COMMUNITY): Payer: Self-pay

## 2024-03-30 ENCOUNTER — Other Ambulatory Visit (HOSPITAL_COMMUNITY): Payer: Self-pay

## 2024-04-21 ENCOUNTER — Other Ambulatory Visit (HOSPITAL_COMMUNITY): Payer: Self-pay

## 2024-04-22 ENCOUNTER — Other Ambulatory Visit (HOSPITAL_COMMUNITY): Payer: Self-pay

## 2024-04-27 ENCOUNTER — Encounter (HOSPITAL_COMMUNITY): Payer: Self-pay | Admitting: Cardiology

## 2024-04-27 ENCOUNTER — Ambulatory Visit (HOSPITAL_COMMUNITY)
Admission: RE | Admit: 2024-04-27 | Discharge: 2024-04-27 | Disposition: A | Discharge status: Home / Self Care | Source: Ambulatory Visit | Attending: Cardiology | Admitting: Cardiology

## 2024-04-27 ENCOUNTER — Other Ambulatory Visit (HOSPITAL_COMMUNITY): Payer: Self-pay

## 2024-04-27 VITALS — BP 122/82 | HR 76 | Ht 69.0 in | Wt 208.2 lb

## 2024-04-27 DIAGNOSIS — Z79899 Other long term (current) drug therapy: Secondary | ICD-10-CM | POA: Insufficient documentation

## 2024-04-27 DIAGNOSIS — J449 Chronic obstructive pulmonary disease, unspecified: Secondary | ICD-10-CM | POA: Diagnosis not present

## 2024-04-27 DIAGNOSIS — I480 Paroxysmal atrial fibrillation: Secondary | ICD-10-CM | POA: Insufficient documentation

## 2024-04-27 DIAGNOSIS — Z7984 Long term (current) use of oral hypoglycemic drugs: Secondary | ICD-10-CM | POA: Diagnosis not present

## 2024-04-27 DIAGNOSIS — I4891 Unspecified atrial fibrillation: Secondary | ICD-10-CM | POA: Diagnosis not present

## 2024-04-27 DIAGNOSIS — Z87891 Personal history of nicotine dependence: Secondary | ICD-10-CM | POA: Diagnosis not present

## 2024-04-27 DIAGNOSIS — Z7901 Long term (current) use of anticoagulants: Secondary | ICD-10-CM | POA: Insufficient documentation

## 2024-04-27 DIAGNOSIS — I5022 Chronic systolic (congestive) heart failure: Secondary | ICD-10-CM | POA: Insufficient documentation

## 2024-04-27 DIAGNOSIS — I10 Essential (primary) hypertension: Secondary | ICD-10-CM

## 2024-04-27 DIAGNOSIS — I110 Hypertensive heart disease with heart failure: Secondary | ICD-10-CM | POA: Diagnosis not present

## 2024-04-27 LAB — ECHOCARDIOGRAM COMPLETE
AR max vel: 2.12 cm2
AV Area VTI: 2.37 cm2
AV Area mean vel: 2.02 cm2
AV Mean grad: 14 mmHg
AV Peak grad: 26 mmHg
Ao pk vel: 2.55 m/s
Area-P 1/2: 2.52 cm2
Calc EF: 62.1 %
S' Lateral: 2.8 cm
Single Plane A2C EF: 65.2 %
Single Plane A4C EF: 58.9 %

## 2024-04-27 MED ORDER — ENTRESTO 97-103 MG PO TABS
1.0000 | ORAL_TABLET | Freq: Two times a day (BID) | ORAL | 11 refills | Status: AC
  Filled 2024-04-27: qty 60, 30d supply, fill #0
  Filled 2024-05-21: qty 60, 30d supply, fill #1
  Filled 2024-06-25: qty 60, 30d supply, fill #2
  Filled 2024-07-24: qty 60, 30d supply, fill #3
  Filled 2024-08-19: qty 60, 30d supply, fill #0
  Filled 2024-08-19: qty 60, 30d supply, fill #4
  Filled 2024-09-18: qty 60, 30d supply, fill #1
  Filled 2024-10-18: qty 60, 30d supply, fill #2
  Filled 2024-11-11: qty 60, 30d supply, fill #3
  Filled 2024-12-11: qty 60, 30d supply, fill #4
  Filled 2025-01-10: qty 60, 30d supply, fill #5
  Filled 2025-02-04: qty 60, 30d supply, fill #6

## 2024-04-27 NOTE — Patient Instructions (Signed)
 There has been no changes to your medications.  Your physician recommends that you schedule a follow-up appointment in: 6 months (October) ** PLEASE CALL THE OFFICE IN JULY TO ARRANGE YOUR FOLLOW UP APPOINTMENT.**  If you have any questions or concerns before your next appointment please send us  a message through [place] or call our office at [phone].    TO LEAVE A MESSAGE FOR THE NURSE SELECT OPTION 2, PLEASE LEAVE A MESSAGE INCLUDING: YOUR NAME DATE OF BIRTH CALL BACK NUMBER REASON FOR CALL**this is important as we prioritize the call backs  YOU WILL RECEIVE A CALL BACK THE SAME DAY AS LONG AS YOU CALL BEFORE 4:00 PM  At the [facility], you and your health needs are our priority. As part of our continuing mission to provide you with exceptional heart care, we have created designated Provider Care Teams. These Care Teams include your primary Cardiologist (physician) and Advanced Practice Providers (APPs- Physician Assistants and Nurse Practitioners) who all work together to provide you with the care you need, when you need it.   You may see any of the following providers on your designated Care Team at your next follow up: Dr Jules Oar Dr Peder Bourdon Dr. Alwin Baars Dr. [name] Amy Marijane Shoulders, NP Ruddy Corral, [place] [facility] [place], [place] Dennise Fitz, NP [place] Lee, NP Shawnee Dellen, NP Luster Salters, PharmD Bevely Brush, PharmD   Please be sure to bring in all your medications bottles to every appointment.    Thank you for choosing [facility]

## 2024-04-28 ENCOUNTER — Other Ambulatory Visit (HOSPITAL_COMMUNITY): Payer: Self-pay

## 2024-04-28 NOTE — Progress Notes (Signed)
   ADVANCED HEART FAILURE FOLLOW UP PATIENT CLINIC NOTE  Referring Physician: Chancey Combe, [place]  E, *  Primary Care: [place], [place]  E, PA-C Primary Cardiologist:  HPI: Natalie Alexander is a 68 y.o. female with a PMH of f PAF, HTN, COPD, former smoker for 50 years, quit 2023, and chronic HFrEF  who presents for initial visit for further evaluation and treatment of heart failure/cardiomyopathy.      Admitted 12/202/4 with A fib RVR and Acute HFrEF.  HIV NR,  TSH ok, HS 28>31. Echo EF 30-35%, RV mildly reduced, and no WMA.   Had TEE/DC-CV with restoration of SR.      SUBJECTIVE: Patient reports that overall she has been feeling well since her last visit.  She denies any shortness of breath, chest pain, orthopnea, PND.  She has been compliant with her medical therapy.  Her blood pressure has been much better controlled.  PMH, current medications, allergies, social history, and family history reviewed in epic.  PHYSICAL EXAM: Vitals:   04/27/24 1501  BP: 122/82  Pulse: 76  SpO2: 96%   GENERAL: Well-appearing, clear tobacco use PULM: Normal work of breathing, CTAB CARDIAC:  JVP: Not elevated         Normal rate with regular rhythm. No murmurs, rubs or gallops.  No edema.  ABDOMEN: Soft, non-tender, non-distended. SKIN: Warm and dry; no lesions or wounds. Warm and well perfused extremities.  DATA REVIEW  ECG: 02/2024: Normal sinus rhythm  ECHO: 12/24/23 TEE and Cardioversion EF 30-35%. Converted to SR.  12/21/23: TTE with LVEF 30-35%, mildly reduced RV, biatrial enlargement 04/27/24: LVEF 70-75%, reported intracavitary gradient but shape of the jet does not appear consistent with LVOT obstruction, is only seen on 1 doppler image, and is faint. 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  EF recovered on most recent echocardiogram.  - Reviewed echo, intercavitary gradient not significant by doppler, only noted on one faint CW jet, and no symptoms. Will continue medical thearpy -Continue Entresto  97/103 mg twice daily -Continue Jardiance  10 mg daily -Lasix  prn -Continue metoprolol  succinate 50 mg daily -No MRA with recovered EF and no volume symptoms -6 month follow up  -Renal function normalized on last check, repeat at next visit  Atrial fibrillation: Currently normal sinus rhythm. -Continue anticoagulation with Eliquis  5 mg twice daily -Consider EP referral for ablation if recurrent  COPD: Former smoker, on inhaler therapy. -Follow-up PCP  Hypertension: Blood pressure controlled on current regimen.  -Continue current therapy as above  Follow up in 6 months    [name], MD Advanced Heart Failure Mechanical Circulatory Support 04/28/24

## 2024-05-06 ENCOUNTER — Other Ambulatory Visit (HOSPITAL_COMMUNITY): Payer: Self-pay

## 2024-05-07 ENCOUNTER — Other Ambulatory Visit (HOSPITAL_COMMUNITY): Payer: Self-pay

## 2024-05-21 ENCOUNTER — Other Ambulatory Visit (HOSPITAL_COMMUNITY): Payer: Self-pay

## 2024-06-04 ENCOUNTER — Other Ambulatory Visit (HOSPITAL_COMMUNITY): Payer: Self-pay

## 2024-06-05 ENCOUNTER — Other Ambulatory Visit (HOSPITAL_COMMUNITY): Payer: Self-pay

## 2024-06-17 ENCOUNTER — Other Ambulatory Visit (HOSPITAL_COMMUNITY): Payer: Self-pay

## 2024-06-25 ENCOUNTER — Other Ambulatory Visit (HOSPITAL_COMMUNITY): Payer: Self-pay

## 2024-06-25 ENCOUNTER — Other Ambulatory Visit: Payer: Self-pay

## 2024-07-02 ENCOUNTER — Other Ambulatory Visit (HOSPITAL_COMMUNITY): Payer: Self-pay

## 2024-07-17 ENCOUNTER — Other Ambulatory Visit (HOSPITAL_COMMUNITY): Payer: Self-pay | Admitting: Cardiology

## 2024-07-17 ENCOUNTER — Other Ambulatory Visit (HOSPITAL_COMMUNITY): Payer: Self-pay

## 2024-07-17 MED ORDER — APIXABAN 5 MG PO TABS
5.0000 mg | ORAL_TABLET | Freq: Two times a day (BID) | ORAL | 5 refills | Status: DC
  Filled 2024-07-17: qty 60, 30d supply, fill #0
  Filled 2024-08-17: qty 60, 30d supply, fill #1
  Filled 2024-08-17: qty 60, 30d supply, fill #0
  Filled 2024-09-12: qty 60, 30d supply, fill #1
  Filled 2024-10-12: qty 60, 30d supply, fill #2
  Filled 2024-11-09: qty 60, 30d supply, fill #3
  Filled 2024-12-07: qty 60, 30d supply, fill #4

## 2024-07-20 ENCOUNTER — Other Ambulatory Visit (HOSPITAL_COMMUNITY): Payer: Self-pay

## 2024-07-20 ENCOUNTER — Other Ambulatory Visit (HOSPITAL_COMMUNITY): Payer: Self-pay | Admitting: Cardiology

## 2024-07-20 ENCOUNTER — Other Ambulatory Visit: Payer: Self-pay

## 2024-07-20 MED ORDER — ATORVASTATIN CALCIUM 40 MG PO TABS
40.0000 mg | ORAL_TABLET | Freq: Every day | ORAL | 5 refills | Status: DC
  Filled 2024-07-20 – 2024-07-21 (×2): qty 30, 30d supply, fill #0
  Filled 2024-08-18: qty 30, 30d supply, fill #1
  Filled 2024-08-19: qty 30, 30d supply, fill #0
  Filled 2024-09-17: qty 30, 30d supply, fill #1
  Filled 2024-10-12: qty 30, 30d supply, fill #2
  Filled 2024-11-10: qty 30, 30d supply, fill #3
  Filled 2024-12-09: qty 30, 30d supply, fill #4

## 2024-07-21 ENCOUNTER — Other Ambulatory Visit (HOSPITAL_COMMUNITY): Payer: Self-pay

## 2024-07-24 ENCOUNTER — Other Ambulatory Visit (HOSPITAL_COMMUNITY): Payer: Self-pay

## 2024-07-27 ENCOUNTER — Other Ambulatory Visit (HOSPITAL_COMMUNITY): Payer: Self-pay

## 2024-08-05 ENCOUNTER — Other Ambulatory Visit (HOSPITAL_COMMUNITY): Payer: Self-pay

## 2024-08-05 ENCOUNTER — Other Ambulatory Visit (HOSPITAL_COMMUNITY): Payer: Self-pay | Admitting: Cardiology

## 2024-08-05 MED ORDER — EMPAGLIFLOZIN 10 MG PO TABS
10.0000 mg | ORAL_TABLET | Freq: Every day | ORAL | 5 refills | Status: AC
  Filled 2024-08-05: qty 30, 30d supply, fill #0
  Filled 2024-08-28: qty 30, 30d supply, fill #1
  Filled 2024-09-27: qty 30, 30d supply, fill #2
  Filled 2024-10-27: qty 30, 30d supply, fill #3
  Filled 2024-11-20: qty 30, 30d supply, fill #4
  Filled 2024-12-20: qty 30, 30d supply, fill #5

## 2024-08-17 ENCOUNTER — Other Ambulatory Visit: Payer: Self-pay

## 2024-08-17 ENCOUNTER — Other Ambulatory Visit (HOSPITAL_COMMUNITY): Payer: Self-pay

## 2024-08-19 ENCOUNTER — Other Ambulatory Visit (HOSPITAL_COMMUNITY): Payer: Self-pay

## 2024-09-16 ENCOUNTER — Other Ambulatory Visit (HOSPITAL_COMMUNITY): Payer: Self-pay

## 2024-09-18 ENCOUNTER — Other Ambulatory Visit (HOSPITAL_COMMUNITY): Payer: Self-pay

## 2024-10-20 NOTE — Progress Notes (Signed)
   ADVANCED HEART FAILURE FOLLOW UP PATIENT CLINIC NOTE  Referring Physician: [place], [place]  E, *  Primary Care: [place], [place]  E, PA-C Primary Cardiologist:  HPI: Natalie Alexander is a 68 y.o. female with a PMH of f PAF, HTN, COPD, former smoker for 50 years, quit 2023, and chronic HFrEF  who presents for initial visit for further evaluation and treatment of heart failure/cardiomyopathy.      Admitted 12/202/4 with A fib RVR and Acute HFrEF.  HIV NR,  TSH ok, HS 28>31. Echo EF 30-35%, RV mildly reduced, and no WMA.   Had TEE/DC-CV with restoration of SR.      SUBJECTIVE: Blood pressure elevated today, but she reports that it is usually in the 130s at home. She does endorse some more shortness of breath with exertion, but denies any palpitations, chest pain, volume symptoms. Has been compliant with her medications.   PMH, current medications, allergies, social history, and family history reviewed in epic.  PHYSICAL EXAM: Vitals:   10/21/24 1010  BP: (!) 156/88  Pulse: 66  SpO2: 93%    GENERAL: NAD, fair appearing PULM:  Normal WOB, coarse breath sounds CARDIAC:  JVP: flat         Normal rate with regular rhythm. No murmurs, rubs or gallops.  No edema. Warm and well perfused extremities. ABDOMEN: Soft, non-tender, non-distended. NEUROLOGIC: Patient is oriented x3 with no focal or lateralizing neurologic deficits.    DATA REVIEW  ECG: 02/2024: Normal sinus rhythm  ECHO: 12/24/23 TEE and Cardioversion EF 30-35%. Converted to SR.  12/21/23: TTE with LVEF 30-35%, mildly reduced RV, biatrial enlargement 04/27/24: LVEF 70-75%, reported intracavitary gradient but shape of the jet does not appear consistent with LVOT obstruction, is only seen on 1 doppler image, and is more consistent with MR   CATH: None     ASSESSMENT & PLAN:  Chronic heart failure with improved EF: Due to tachyarrhythmia, no recurrence of heart failure symptoms in sinus rhythm.  Suspect that  shortness of breath more related to untreated pulmonary disease as noted below.  - Reviewed echo, intercavitary gradient not significant by doppler, only noted on one faint CW jet, and no symptoms. Will continue medical thearpy -Continue Entresto  97/103 mg twice daily -Continue Jardiance  10 mg daily -Lasix  prn -Continue metoprolol  succinate 50 mg daily, consider switch to bisoprolol based on PFTs -No MRA with recovered EF and no volume symptoms, can consider if BP remains elevated -6 month follow up   Atrial fibrillation: Currently normal sinus rhythm. -Continue anticoagulation with Eliquis  5 mg twice daily -Consider EP referral for ablation if recurrent  COPD: Former very heavy smoker, on inhaler therapy but nothing regular. - PFTs ordered, will follow up with pulmonary - Likely needs controller therapy  Hypertension: Blood pressure controlled on current regimen at home -Continue current therapy as above  Follow up in 6 months    [name], MD Advanced Heart Failure Mechanical Circulatory Support 10/23/24

## 2024-10-21 ENCOUNTER — Ambulatory Visit (HOSPITAL_COMMUNITY)
Admission: RE | Admit: 2024-10-21 | Discharge: 2024-10-21 | Disposition: A | Discharge status: Home / Self Care | Source: Ambulatory Visit | Attending: Cardiology | Admitting: Cardiology

## 2024-10-21 ENCOUNTER — Other Ambulatory Visit (HOSPITAL_COMMUNITY): Payer: Self-pay

## 2024-10-21 VITALS — BP 156/88 | HR 66 | Wt 227.2 lb

## 2024-10-21 DIAGNOSIS — J449 Chronic obstructive pulmonary disease, unspecified: Secondary | ICD-10-CM | POA: Diagnosis not present

## 2024-10-21 DIAGNOSIS — I4891 Unspecified atrial fibrillation: Secondary | ICD-10-CM | POA: Diagnosis not present

## 2024-10-21 DIAGNOSIS — Z7901 Long term (current) use of anticoagulants: Secondary | ICD-10-CM | POA: Diagnosis not present

## 2024-10-21 DIAGNOSIS — Z7984 Long term (current) use of oral hypoglycemic drugs: Secondary | ICD-10-CM | POA: Diagnosis not present

## 2024-10-21 DIAGNOSIS — I5022 Chronic systolic (congestive) heart failure: Secondary | ICD-10-CM | POA: Diagnosis present

## 2024-10-21 DIAGNOSIS — Z87891 Personal history of nicotine dependence: Secondary | ICD-10-CM | POA: Insufficient documentation

## 2024-10-21 DIAGNOSIS — Z79899 Other long term (current) drug therapy: Secondary | ICD-10-CM | POA: Insufficient documentation

## 2024-10-21 DIAGNOSIS — I110 Hypertensive heart disease with heart failure: Secondary | ICD-10-CM | POA: Insufficient documentation

## 2024-10-21 DIAGNOSIS — R0602 Shortness of breath: Secondary | ICD-10-CM | POA: Diagnosis not present

## 2024-10-21 DIAGNOSIS — I480 Paroxysmal atrial fibrillation: Secondary | ICD-10-CM | POA: Diagnosis not present

## 2024-10-21 MED ORDER — STIOLTO RESPIMAT 2.5-2.5 MCG/ACT IN AERS
1.0000 | INHALATION_SPRAY | RESPIRATORY_TRACT | 2 refills | Status: AC | PRN
  Filled 2024-10-21: qty 4, 30d supply, fill #0

## 2024-10-21 NOTE — Patient Instructions (Signed)
 There has been no changes to your medications.  Your provider has ordered a lung function test for you.  Your physician recommends that you schedule a follow-up appointment in: 1 year ( October 2026) ** PLEASE CALL THE OFFICE IN AUGUST 2026 TO ARRANGE YOUR FOLLOW UP APPOINTMENT.**  If you have any questions or concerns before your next appointment please send us  a message through [place] or call our office at [phone].    TO LEAVE A MESSAGE FOR THE NURSE SELECT OPTION 2, PLEASE LEAVE A MESSAGE INCLUDING: YOUR NAME DATE OF BIRTH CALL BACK NUMBER REASON FOR CALL**this is important as we prioritize the call backs  YOU WILL RECEIVE A CALL BACK THE SAME DAY AS LONG AS YOU CALL BEFORE 4:00 PM  At the [facility], you and your health needs are our priority. As part of our continuing mission to provide you with exceptional heart care, we have created designated Provider Care Teams. These Care Teams include your primary Cardiologist (physician) and Advanced Practice Providers (APPs- Physician Assistants and Nurse Practitioners) who all work together to provide you with the care you need, when you need it.   You may see any of the following providers on your designated Care Team at your next follow up: Dr Toribio Fuel Dr Ezra Shuck Dr. Ria Commander Dr. [name] Amy Lenetta, NP Caffie Shed, [place] [facility] [place], [place] Beckey Coe, NP [place] Lee, NP Ellouise Class, NP Tinnie Redman, PharmD Jaun Bash, PharmD   Please be sure to bring in all your medications bottles to every appointment.    Thank you for choosing [facility]

## 2024-10-29 ENCOUNTER — Other Ambulatory Visit (HOSPITAL_COMMUNITY): Payer: Self-pay | Admitting: Respiratory Therapy

## 2024-10-29 ENCOUNTER — Ambulatory Visit (HOSPITAL_COMMUNITY)
Admission: RE | Admit: 2024-10-29 | Discharge: 2024-10-29 | Disposition: A | Discharge status: Home / Self Care | Source: Ambulatory Visit | Attending: Cardiology | Admitting: Cardiology

## 2024-10-29 DIAGNOSIS — J449 Chronic obstructive pulmonary disease, unspecified: Secondary | ICD-10-CM | POA: Insufficient documentation

## 2024-10-29 LAB — PULMONARY FUNCTION TEST
DL/VA % pred: 63 %
DL/VA: 2.54 ml/min/mmHg/L
DLCO unc % pred: 50 %
DLCO unc: 11.63 ml/min/mmHg
FEF 25-75 Post: 0.96 L/s
FEF 25-75 Pre: 1.08 L/s
FEF2575-%Change-Post: -11 %
FEF2575-%Pred-Post: 42 %
FEF2575-%Pred-Pre: 48 %
FEV1-%Change-Post: -1 %
FEV1-%Pred-Post: 68 %
FEV1-%Pred-Pre: 69 %
FEV1-Post: 1.92 L
FEV1-Pre: 1.95 L
FEV1FVC-%Change-Post: 1 %
FEV1FVC-%Pred-Pre: 86 %
FEV6-%Change-Post: -3 %
FEV6-%Pred-Post: 79 %
FEV6-%Pred-Pre: 82 %
FEV6-Post: 2.8 L
FEV6-Pre: 2.91 L
FEV6FVC-%Change-Post: 0 %
FEV6FVC-%Pred-Post: 101 %
FEV6FVC-%Pred-Pre: 102 %
FVC-%Change-Post: -2 %
FVC-%Pred-Post: 78 %
FVC-%Pred-Pre: 80 %
FVC-Post: 2.87 L
FVC-Pre: 2.95 L
Post FEV1/FVC ratio: 67 %
Post FEV6/FVC ratio: 98 %
Pre FEV1/FVC ratio: 66 %
Pre FEV6/FVC Ratio: 99 %
RV % pred: 98 %
RV: 2.37 L
TLC % pred: 93 %
TLC: 5.45 L

## 2024-10-29 MED ORDER — ALBUTEROL SULFATE (2.5 MG/3ML) 0.083% IN NEBU
2.5000 mg | INHALATION_SOLUTION | Freq: Once | RESPIRATORY_TRACT | Status: AC
  Administered 2024-10-29: 2.5 mg via RESPIRATORY_TRACT

## 2024-12-19 ENCOUNTER — Other Ambulatory Visit (HOSPITAL_COMMUNITY): Payer: Self-pay | Admitting: Adult Health

## 2024-12-19 DIAGNOSIS — I5033 Acute on chronic diastolic (congestive) heart failure: Secondary | ICD-10-CM

## 2024-12-22 ENCOUNTER — Other Ambulatory Visit: Payer: Self-pay

## 2024-12-22 ENCOUNTER — Other Ambulatory Visit (HOSPITAL_COMMUNITY): Payer: Self-pay

## 2024-12-22 MED ORDER — METOPROLOL SUCCINATE ER 50 MG PO TB24
50.0000 mg | ORAL_TABLET | Freq: Every day | ORAL | 3 refills | Status: AC
  Filled 2024-12-22: qty 90, 90d supply, fill #0

## 2025-01-03 ENCOUNTER — Other Ambulatory Visit (HOSPITAL_COMMUNITY): Payer: Self-pay | Admitting: Cardiology

## 2025-01-04 ENCOUNTER — Other Ambulatory Visit (HOSPITAL_COMMUNITY): Payer: Self-pay

## 2025-01-04 MED ORDER — APIXABAN 5 MG PO TABS
5.0000 mg | ORAL_TABLET | Freq: Two times a day (BID) | ORAL | 5 refills | Status: AC
  Filled 2025-01-04 – 2025-01-09 (×2): qty 60, 30d supply, fill #0
  Filled 2025-02-04: qty 60, 30d supply, fill #1

## 2025-01-04 MED ORDER — ATORVASTATIN CALCIUM 40 MG PO TABS
40.0000 mg | ORAL_TABLET | Freq: Every day | ORAL | 5 refills | Status: AC
  Filled 2025-01-04 – 2025-01-09 (×2): qty 30, 30d supply, fill #0
  Filled 2025-02-03: qty 30, 30d supply, fill #1

## 2025-01-05 ENCOUNTER — Encounter: Payer: Self-pay | Admitting: Pharmacist

## 2025-01-05 ENCOUNTER — Other Ambulatory Visit: Payer: Self-pay

## 2025-01-08 ENCOUNTER — Other Ambulatory Visit: Payer: Self-pay

## 2025-01-09 ENCOUNTER — Other Ambulatory Visit (HOSPITAL_COMMUNITY): Payer: Self-pay

## 2025-01-10 ENCOUNTER — Other Ambulatory Visit: Payer: Self-pay

## 2025-01-11 ENCOUNTER — Other Ambulatory Visit: Payer: Self-pay

## 2025-01-12 ENCOUNTER — Other Ambulatory Visit (HOSPITAL_COMMUNITY): Payer: Self-pay

## 2025-01-12 ENCOUNTER — Other Ambulatory Visit: Payer: Self-pay

## 2025-01-12 ENCOUNTER — Encounter: Payer: Self-pay | Admitting: Pharmacist

## 2025-02-01 ENCOUNTER — Other Ambulatory Visit (HOSPITAL_COMMUNITY): Payer: Self-pay
